# Patient Record
Sex: Female | Born: 1950
Health system: Southern US, Community
[De-identification: ages and names within clinical notes are randomized; demographics above are authoritative.]

## PROBLEM LIST (undated history)

## (undated) DIAGNOSIS — M8580 Other specified disorders of bone density and structure, unspecified site: Secondary | ICD-10-CM

## (undated) DIAGNOSIS — I493 Ventricular premature depolarization: Secondary | ICD-10-CM

## (undated) DIAGNOSIS — E785 Hyperlipidemia, unspecified: Secondary | ICD-10-CM

## (undated) DIAGNOSIS — F419 Anxiety disorder, unspecified: Secondary | ICD-10-CM

## (undated) DIAGNOSIS — F329 Major depressive disorder, single episode, unspecified: Secondary | ICD-10-CM

## (undated) DIAGNOSIS — I10 Essential (primary) hypertension: Secondary | ICD-10-CM

## (undated) DIAGNOSIS — E669 Obesity, unspecified: Secondary | ICD-10-CM

## (undated) DIAGNOSIS — F32A Depression, unspecified: Secondary | ICD-10-CM

## (undated) DIAGNOSIS — T7840XA Allergy, unspecified, initial encounter: Secondary | ICD-10-CM

## (undated) DIAGNOSIS — H269 Unspecified cataract: Secondary | ICD-10-CM

## (undated) HISTORY — DX: Hyperlipidemia, unspecified: E78.5

## (undated) HISTORY — PX: CATARACT EXTRACTION: SUR2

## (undated) HISTORY — DX: Essential (primary) hypertension: I10

## (undated) HISTORY — PX: WISDOM TOOTH EXTRACTION: SHX21

## (undated) HISTORY — PX: BROW LIFT: SHX178

## (undated) HISTORY — DX: Depression, unspecified: F32.A

## (undated) HISTORY — DX: Anxiety disorder, unspecified: F41.9

## (undated) HISTORY — DX: Obesity, unspecified: E66.9

## (undated) HISTORY — DX: Major depressive disorder, single episode, unspecified: F32.9

## (undated) HISTORY — PX: TONSILLECTOMY: SUR1361

## (undated) HISTORY — PX: TUBAL LIGATION: SHX77

## (undated) HISTORY — DX: Unspecified cataract: H26.9

## (undated) HISTORY — DX: Allergy, unspecified, initial encounter: T78.40XA

## (undated) HISTORY — PX: OTHER SURGICAL HISTORY: SHX169

## (undated) HISTORY — DX: Other specified disorders of bone density and structure, unspecified site: M85.80

## (undated) HISTORY — DX: Ventricular premature depolarization: I49.3

---

## 2003-07-11 HISTORY — PX: COLONOSCOPY: SHX174

## 2004-05-23 ENCOUNTER — Ambulatory Visit: Payer: Self-pay | Admitting: Gastroenterology

## 2004-06-06 ENCOUNTER — Ambulatory Visit: Payer: Self-pay | Admitting: Gastroenterology

## 2005-01-23 ENCOUNTER — Encounter: Admission: RE | Admit: 2005-01-23 | Discharge: 2005-01-23 | Payer: Self-pay | Admitting: Internal Medicine

## 2005-03-16 ENCOUNTER — Ambulatory Visit (HOSPITAL_BASED_OUTPATIENT_CLINIC_OR_DEPARTMENT_OTHER): Admission: RE | Admit: 2005-03-16 | Discharge: 2005-03-16 | Payer: Self-pay | Admitting: Orthopedic Surgery

## 2005-03-16 ENCOUNTER — Ambulatory Visit (HOSPITAL_COMMUNITY): Admission: RE | Admit: 2005-03-16 | Discharge: 2005-03-16 | Payer: Self-pay | Admitting: Orthopedic Surgery

## 2006-10-03 ENCOUNTER — Encounter: Admission: RE | Admit: 2006-10-03 | Discharge: 2006-10-03 | Payer: Self-pay | Admitting: Emergency Medicine

## 2006-10-04 ENCOUNTER — Ambulatory Visit: Payer: Self-pay

## 2006-10-29 ENCOUNTER — Ambulatory Visit: Payer: Self-pay | Admitting: Cardiology

## 2006-11-20 ENCOUNTER — Ambulatory Visit: Payer: Self-pay | Admitting: Cardiology

## 2010-11-22 NOTE — Assessment & Plan Note (Signed)
[facility]                            CARDIOLOGY OFFICE NOTE   NAME:Twichell, TASHIBA TIMONEY                         MRN:          [id number]  DATE:11/20/2006                            DOB:          16-Sep-1950    FOLLOWUP VISIT   PRIMARY CARE PHYSICIAN:  Dr. Elmore Guise.   REASON FOR VISIT:  Followup chest pain.   HISTORY OF PRESENT ILLNESS:  I saw Ms. Ciavarella back in late April.  Her  history and testing is detailed in my previous note.  Since that visit,  she was able to see Dr. Chilton Si back in the office.  She has been taken  off of Crestor and states that actually some of her symptoms of chest  aching have improved since then.  We spoke last time about possible  followup testing, including noninvasive and invasive techniques.  Her  symptoms were atypical and I did not feel strongly about a cardiac  catheterization, although we did discuss it as a possible option.  On  the one hand, she is reassured by the improvements in her symptoms off  Crestor.  In some respects she worries about a component of stress,  predominantly centering around care for her elderly ill mother.  On the  other hand, she remains concerned to some degree about coronary disease  given her family history.  She is not yet ready to consider a cardiac  catheterization, however.   ALLERGIES:  No known drug allergies.   PRESENT MEDICATIONS:  1. Wellbutrin XL 300 mg p.o. daily.  2. Aspirin 81 mg p.o. daily.  3. Multivitamin 1 p.o. daily.   REVIEW OF SYSTEMS:  As described in the history of present illness.   EXAMINATION:  Blood pressure is 132/84, heart rate is 89, weight 146  pounds.  The patient was not otherwise re-examined today.   IMPRESSION/RECOMMENDATIONS:  History of atypical chest pain, relatively  improved although not completely, having stopped Crestor.  She had an  overall reassuring exercise Myoview without electrocardiographic or  perfusion data evidence of ischemia.  She  did experience chest pain  during the study.  At this point, she generally remains most comfortable  with observation only, although does still have some concerns about  potential cardiovascular disease given her family history.  We had  discussed previously the possibility of a cardiac CT angiogram as a more  anatomic way of assessing her, still in a noninvasive mode.  I think  this would be a reasonable option, however would be most useful if it  were completely normal.  Otherwise, it may actually lead to additional  studies.  She will consider the matter further and  already has scheduled followup with Dr. Chilton Si at the end of the month.  We remain available to assist with additional testing if the situation  changes.     Jonelle Sidle, MD  Electronically Signed    SGM/MedQ  DD: 11/20/2006  DT: 11/20/2006  Job #: [id number]   cc:   Erskine Speed, M.D.

## 2010-11-25 NOTE — Assessment & Plan Note (Signed)
[facility]                            CARDIOLOGY OFFICE NOTE   NAME:[name], [name] REVERON                         MRN:          [id number]  DATE:10/29/2006                            DOB:          1951-01-30    REFERRING PHYSICIAN:  Stan Head. [name] [name], M.D.   PRIMARY CARE PHYSICIAN:  Dr. Elmore [name].   INDICATION:  Chest pain, followup recent stress test.   HISTORY OF PRESENT ILLNESS:  [name] [name] is a pleasant 60 year old woman  with a history of hyperlipidemia recently treated with Crestor,  palpitations with remotely documented premature ventricular complexes  (saw [name] [name] in the past), and family history of premature  cardiovascular disease including the patient's father and uncles  experiencing cardiovascular disease in their 95s to 32s.  She reports  being in her usual state of health until March.  She was driving and  began to develop an aching sensation in her left lower costal area.  This seemed to progress in intensity, but remained intermittent over the  next several days and ultimately by the next week she sought medical  attention when she began to experience this more into her chest area.  She noted that this discomfort was not exacerbated with exertion and  felt more like a soreness particularly around the left lower costal  margin.  She ultimately ended up seeing Dr. Cleta [name] who arranged a number  of followup studies including a chest x-ray demonstrating no active  disease, CT scan of the chest demonstrating no pulmonary embolus or  other acute finding, blood work indicating no leukocytosis and normal  renal function as well as liver function tests.  An exercise Myoview was  performed in our office on the 27th of March.  This particular study was  electrocardiographically negative for ischemia and showed no clear  evidence of scar or ischemia based on perfusion data with vigorous  ejection fraction of 76%.  The patient did experience chest pain  during  this study, and due to this is referred to discuss things further with  [place] today.   Electrocardiogram today demonstrates normal sinus rhythm at 75 beats per  minute.  [name] [name] states that she still intermittently feels discomfort  in her lower left costal area as discussed above, although it seems to  be less pronounced.  We discussed this today and the reassuring results  of her testing to this point.  Her Myoview is overall low risk, and I  discussed this with her today.  We talked about a number of options from  a cardiac perspective including either simple observation given her  reassuring Myoview findings versus the possibility of a cardiac CT  angiogram as an intermediate step versus continuing on to a diagnostic  cardiac catheterization.  [name] [name]'s symptoms are atypical from the  perspective of ischemia, and after our discussion we felt like  observation might be the best strategy from a cardiac perspective.  She  tells me that she is due for a followup visit with [name] [name] tomorrow  and plans to discuss the situation with him as  well.   ALLERGIES:  No known drug allergies.   PRESENT MEDICATIONS:  1. Wellbutrin XL 300 mg p.o. daily.  2. Crestor 10 mg p.o. daily.  3. Aspirin 81 mg p.o. daily.  4. Multivitamin one p.o. daily.  5. She takes Advair and Tylenol p.r.n.   PAST MEDICAL HISTORY:  As outlined above.  She reports a remote standard  treadmill test approximately 10 years ago and also an evaluation by Dr.  Graciela [name] due to premature ventricular complexes.  She states that these  have been very infrequent and not bothersome recently.  She has a  history of right shoulder surgery in 2006 and also a tubal ligation back  in the late 1980s.   FAMILY HISTORY:  As discussed above and includes premature  cardiovascular disease.   REVIEW OF SYSTEMS:  As per history of present illness.  She has had some  fatigue, anxiety, and depression symptoms.  No dizziness,  persistent  palpitations and syncope, orthopnea, PND, lower extremity edema or  claudication.   SOCIAL HISTORY:  The patient is married.  She has two children.  She  denies any active tobacco use.  Quit smoking in 1977.  She drinks one or  two alcoholic beverages daily and also 2-3 caffeinated beverages daily.  She has been trying to walk regularly sometimes up to 2-3 miles at a  time and notes that this does not worsen her chest discomfort.  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   PHYSICAL EXAMINATION:  VITAL SIGNS:  Blood pressure 136/84, heart rate  76, weight 153 pounds.  GENERAL:  The patient is comfortable and in no acute distress.  HEENT:  Conjunctivae is normal, oropharynx clear.  NECK:  Supple, no elevated jugular venous pressure without bruits.  No  thyromegaly is noted.  No thyroid tenderness noted.  LUNGS:  Clear without labored breathing.  Normal lung expansion  bilaterally.  CARDIAC:  Reveals a regular rate and rhythm.  No obvious pericardial  rub, no S3 gallop or loud murmur.  ABDOMEN:  Generally soft, nondistended.  Normoactive bowel sounds.  EXTREMITIES:  Exhibit no pitting edema.  SKIN:  Warm and dry.  MUSCULOSKELETAL:  No kyphosis is noted.  EXTREMITIES:  Distal pulses 2+.  NEUROPSYCHIATRIC:  Patient alert and oriented x3.  Affect is normal.   IMPRESSION/RECOMMENDATIONS:  1. History of intermittent atypical chest pain as discussed above in a      60 year old woman with history of hyperlipidemia recently placed on      statin therapy and family history of premature cardiovascular      disease.  Her electrocardiogram at baseline is normal, and she had      a recent exercise Myoview which showed no electrocardiographic or      perfusion evidence of ischemia.  She did experience some chest pain      during the study, but states that this has been intermittent since     March and typically is not worsened with exertion on  any regular      basis.  I discussed this with her today in detail and offered a      number of options as a next step as reviewed above.  We ultimately      elected to consider observation only at this point with planned      followup with [name] [name] tomorrow.  I think proceeding with      additional stress testing by another  modality would probably not      add any significant information to the present data.  A noninvasive      cardiac CT angiogram could be considered, although it would be only      useful if it were found to be completely normal.  One could      consider proceeding on to a diagnostic cardiac catheterization to      exclude the possibility of a false normal noninvasive test and      clearly assessed the coronary anatomy.  I do not feel strongly      about this, and [name] [name] indicated that she preferred observation.      Realizing that this would be an option if her symptoms persist or      worsen, she seemed most comfortable with this and I asked her to      discuss it further with  [name] [name].  I will, otherwise, plan to have her come back over the next  month to review her symptoms and to determine if we need to proceed  further with testing.  1. Further plans to follow.     Jonelle Sidle, MD  Electronically Signed    SGM/MedQ  DD: 10/29/2006  DT: 10/30/2006  Job #: [id number]   cc:   Brett Canales A. [name] [name], M.D.  Erskine Speed, M.D.

## 2010-11-25 NOTE — Op Note (Signed)
NAMELYNDZEE, KLIEBERT                  ACCOUNT NO.:  [id number]   MEDICAL RECORD NO.:  [id number]          PATIENT TYPE:  AMB   LOCATION:  DSC                          FACILITY:  [facility]   PHYSICIAN:  Katy Fitch. Sypher, M.D. DATE OF BIRTH:  01-May-1951   DATE OF PROCEDURE:  03/16/2005  DATE OF DISCHARGE:                                 OPERATIVE REPORT   PREOP DIAGNOSIS:  Painful adhesive capsulitis, right shoulder with evidence  of chronic subacromial impingement.   POSTOP DIAGNOSIS:  Painful adhesive capsulitis, right shoulder with evidence  of chronic subacromial impingement,with confirmation of extensive adhesive  capsulitis with rotator cuff tendinopathy without evidence of retracted  rotator cuff tear and AC degenerative change without prominent inferior  projecting osteophytes.   OPERATION:  1.  Examination of right shoulder under anesthesia with manipulation and      release of capsular adhesions.  2.  Arthroscopic evaluation right glenohumeral joint with extensive      debridement of adhesive capsulitis granulation tissue and capsulectomy      utilizing electrocautery and suction shaver.  3.  Is subacromial decompression with extensive bursectomy and tenolysis of      rotator cuff followed by coracoacromial ligament release and      acromioplasty.   OPERATING SURGEON:  Josephine Igo.   ASSISTANT:  Annye Rusk PA-C.   ANESTHESIA:  General by endotracheal technique supplemented by a right  interscalene block, supervising anesthesiologist Dr. Jacklynn Bue.   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 Nila Nephew for evaluation and  management of a painful right shoulder. She was seen in consultation February 08, 2005.   Dr. Chilton Si has identified a probable adhesive capsulitis of the shoulder and  had sent Mrs. Cumberledge for a MRI on January 23, 2005. This was accomplished at  [facility] and  interpreted by Dr. Holley Dexter to reveal  evidence of rotator cuff tendinopathy with a probable partial width tear at  the anterior supraspinatus without retraction or atrophy.  Mrs. Branson was  also noted to have a downsloping anatomy that created a mass effect on the  cuff and degenerative change at the [facility] joint. There was a chronic  subacromial and subdeltoid bursitis.   Clinical examination in the office on February 08, 2005 revealed significant  decrease in range of motion of the right shoulder with combined elevation 90  degrees limited by significant pain, internal rotation of the iliac crest  and external rotation of 60 degrees versus elevation 175 on the left,  external rotation 90 on the left and internal rotation to at least T12.   We advised a supervised course of therapy with appropriate analgesic  medication.   Mrs. Cid was seen in consultation by my physical therapist Berline Lopes,  who provided therapy services for 3 weeks.   Mrs. Reesman was unable to make any progress in therapy and had persistent  significant discomfort.   Due to failure to respond to nonoperative measures, we concluded that was  appropriate to proceed with  examination of the shoulder under anesthesia,  anticipating manipulation of the adhesive capsulitis, followed by  arthroscopic debridement and coagulation of granulation tissues.   Given the unfavorable acromial anatomy, a subacromial decompression was  anticipated with possible distal clavicle resection and possible rotator  cuff repair if direct observation confirmed the MRI findings.   After informed consent, Mrs. Snethen was brought to the operating room at this  time.   PROCEDURE:  Marae Cottrell is brought to the operating room and placed in supine  position on the table.   Following anesthesia consultation with Dr. Jacklynn Bue, a right interscalene  block was placed without complication.   Satisfactory anesthesia of the right forequarter was  achieved.   Ms. Pankonin was transferred to room one and placed in supine position on the  operating table. Under direct supervision of Dr. Jacklynn Bue, general  endotracheal anesthesia was induced followed by careful positioning in the  beach-chair position with the aid of a torso and head holder designed for  shoulder arthroscopy.   Examination of the right shoulder under anesthesia revealed combined  elevation of 80 degrees, external rotation of 60 degrees and internal  rotation of less than 10 degrees.   With gentle manipulation, lysis of the adhesions was audible and palpable  with recovery of elevation 175, external rotation of 90 and 90 degrees  abduction and internal rotation of approximately 80 degrees at 90 degrees  abduction. The glenohumeral joint remained stable to anterior posterior as  well as superior inferior translational stress.   The right arm and forequarter were then prepped with DuraPrep and draped  with impervious arthroscopy drapes.   The shoulder was distended with 20 mL of sterile saline brought in through a  spinal needle with an anterior approach. The scope was placed atraumatically  with a blunt trocar through a standard posterior viewing portal.   Diagnostic arthroscopy revealed a typical granulation tissue and clot  associated with adhesive capsulitis and manipulation.   The biceps origin and labrum was basically intact except for granulation  tissue anteriorly. The subscapularis tendon was invested with inflammatory  angiofibroblastic tissue leading to significant adhesions. The biceps tendon  was normal to the rotator interval. The subscapularis appeared to be intact.  The deep surface of the supraspinatus, infraspinatus and teres minor tendons  was easily visualized and found to be free of a retracted rotator cuff tear.  The inferior recess was inspected and found to have a moderate degree of granulation tissue with an intact labrum inferiorly and  posteriorly.   A 4.5-mm suction shaver was brought in anteriorly and used to perform  debridement of the granulations of the subscapularis and tenolysis of the  subscapularis. The rotator interval was released with suction shaver  debridement followed by use of bipolar cautery for hemostasis.   Photographs were obtained pre and post debridement to document the degree of  capsulotomy.   After release of the adhesions, external rotation of the arm at neutral of  80 degrees was achieved with excellent glide of the subscapularis.   After hemostasis was completed the scope was removed from glenohumeral joint  and placed in subacromial space.   An abundant bursitis was identified with significant inflammatory  granulations noted.   Photographic documentation of the bursal predicament was obtained followed  by use of the suction shaver to debride redundant bursa. The rotator cuff  tendons were tenolysed with the suction shaver followed by use of the  bipolar cautery device to obtain hemostasis and release  the coracoacromial  ligament. The acromion had a type 2 morphology. This was leveled with a  suction shaver to a type 1 morphology.   The [facility] joint was inspected and found to have some capsular inflammation  however, the distal clavicle was not prominent inferiorly and in my judgment  distal clavicle resection was not indicated at this time.   The rotator cuff tendons were cleaned of redundant bursa to a smooth  surface.   I could not identify a full-thickness rotator cuff tear but did identify and  area of significant bruising and hemorrhage laterally that probably  corresponded with the arthroscopic finding of severe tendinopathy.   There appeared to be no indications for rotator cuff repair. After final  hemostasis, the arthroscopic equipment was removed from the subacromial  space and the portals were repaired with mattress suture of 3-0 Prolene.   A compressive dressing applied  with Xeroflo, sterile gauze and Hypafix. Ms.  Eddleman was awakened from anesthesia and transferred to the recovery room with  stable vital signs.   PAS compression hose were used throughout the procedure as a deep vein  thrombosis prophylaxis.   We will encourage Ms. Henricksen to begin immediate ambulation and gentle  exercises for range of motion of the elbow and shoulder.   She will return to our office in 24 hours for inspection of her portals and  documentation x-ray followed by initiation of a gentle range of motion  exercise program.      Katy Fitch. Sypher, M.D.  Electronically Signed     RVS/MEDQ  D:  03/16/2005  T:  03/16/2005  Job:  [id number]   cc:   Erskine Speed, M.D.  [address]  [place]  [place] [address]  Fax: [phone]

## 2011-08-20 ENCOUNTER — Ambulatory Visit: Payer: BC Managed Care – PPO | Admitting: Physician Assistant

## 2011-08-20 VITALS — BP 149/87 | HR 101 | Temp 97.8°F | Resp 16 | Ht 64.0 in | Wt 167.2 lb

## 2011-08-20 DIAGNOSIS — J029 Acute pharyngitis, unspecified: Secondary | ICD-10-CM

## 2011-08-20 MED ORDER — IPRATROPIUM BROMIDE 0.03 % NA SOLN: 2.0000 | Freq: Two times a day (BID) | NASAL | Status: DC

## 2011-08-20 MED ORDER — GUAIFENESIN ER 1200 MG PO TB12: 1.0000 | ORAL_TABLET | Freq: Two times a day (BID) | ORAL | Status: DC | PRN

## 2011-08-20 NOTE — Progress Notes (Signed)
  Subjective:    Patient ID: [name] [name], female    DOB: [birth year], 61 y.o.   MRN: [id number]  HPI The patient presents with 2 weeks of sore throat. No fever or chills. No myalgias, arthralgias or rash. A little bit of nasal congestion and postnasal drainage. Also a little bit of ear fullness and popping. She has been sneezing intermittently. She doesn't feel bad but is getting ready to leave town and was concerned about the persistence of her symptoms.   Review of Systems As above. Otherwise negative.    Objective:   Physical Exam  Vitals reviewed. Constitutional: She is oriented to person, place, and time. She appears well-developed and well-nourished. No distress.  HENT:  Head: Normocephalic and atraumatic.  Right Ear: Hearing, tympanic membrane, external ear and ear canal normal.  Left Ear: Hearing, tympanic membrane, external ear and ear canal normal.  Nose: Mucosal edema and rhinorrhea present.  No foreign bodies. Right sinus exhibits no maxillary sinus tenderness and no frontal sinus tenderness. Left sinus exhibits no maxillary sinus tenderness and no frontal sinus tenderness.  Mouth/Throat: Uvula is midline, oropharynx is clear and moist and mucous membranes are normal. No uvula swelling. No oropharyngeal exudate.  Eyes: Conjunctivae and EOM are normal. Pupils are equal, round, and reactive to light. Right eye exhibits no discharge. Left eye exhibits no discharge. No scleral icterus.  Neck: Trachea normal, normal range of motion and full passive range of motion without pain. Neck supple. No mass and no thyromegaly present.  Cardiovascular: Normal rate, regular rhythm and normal heart sounds.   Pulmonary/Chest: Effort normal and breath sounds normal.  Lymphadenopathy:       Head (right side): No submandibular, no tonsillar, no preauricular, no posterior auricular and no occipital adenopathy present.       Head (left side): No submandibular, no tonsillar, no preauricular and no  occipital adenopathy present.    She has no cervical adenopathy.       Right: No supraclavicular adenopathy present.       Left: No supraclavicular adenopathy present.  Neurological: She is alert and oriented to person, place, and time. She has normal strength. No cranial nerve deficit or sensory deficit.  Skin: Skin is warm, dry and intact. No rash noted.  Psychiatric: She has a normal mood and affect. Her speech is normal and behavior is normal.      Results for orders placed in visit on 08/20/11  POCT RAPID STREP A (OFFICE)      Component Value Range   Rapid Strep A Screen Negative  Negative     Assessment & Plan:  Pharyngitis, mild viral URI.  Supportive care. OTC Mucinex maximum strength, Atrovent nasal spray.  Her blood pressure is elevated today. When she as well she will have it rechecked. If it remains elevated she will need medication.

## 2011-08-20 NOTE — Patient Instructions (Signed)
Rest, drink lots of liquids.  Get you blood pressure checked when you're well!

## 2011-08-29 ENCOUNTER — Telehealth: Payer: Self-pay

## 2011-08-29 NOTE — Telephone Encounter (Signed)
Pt left her insurance card here and would like it mailed to her  Address in epic is correct.

## 2011-09-12 ENCOUNTER — Telehealth: Payer: Self-pay

## 2012-06-13 NOTE — Telephone Encounter (Signed)
No message

## 2014-12-11 ENCOUNTER — Encounter: Payer: Self-pay | Admitting: Gastroenterology

## 2015-02-10 ENCOUNTER — Encounter: Payer: Self-pay | Admitting: Gastroenterology

## 2015-02-19 ENCOUNTER — Encounter: Payer: Self-pay | Admitting: Gastroenterology

## 2015-11-22 ENCOUNTER — Encounter: Payer: Self-pay | Admitting: Gastroenterology

## 2015-11-22 ENCOUNTER — Telehealth: Payer: Self-pay | Admitting: Gastroenterology

## 2015-11-22 NOTE — Telephone Encounter (Signed)
OK 

## 2015-11-22 NOTE — Telephone Encounter (Signed)
LM on voicemail to call back to schedule an appt

## 2016-01-10 ENCOUNTER — Ambulatory Visit (AMBULATORY_SURGERY_CENTER): Payer: Self-pay | Admitting: *Deleted

## 2016-01-10 VITALS — Ht 64.0 in | Wt 175.0 lb

## 2016-01-10 DIAGNOSIS — Z1211 Encounter for screening for malignant neoplasm of colon: Secondary | ICD-10-CM

## 2016-01-10 MED ORDER — NA SULFATE-K SULFATE-MG SULF 17.5-3.13-1.6 GM/177ML PO SOLN: 1.0000 | Freq: Once | ORAL | Status: DC

## 2016-01-10 NOTE — Progress Notes (Signed)
No egg or soy allergy known to patient  No issues with past sedation with any surgeries  or procedures, no intubation problems  No diet pills per patient No home 02 use per patient  No blood thinners per patient  Pt denies issues with constipation  emmi declined Pt had called and asked to change providers to stark, starl ok'd and appt was scheduled with nandigam. Cancelled nandigam and RS with pt to stark 8-1 at 330 pm.

## 2016-01-24 ENCOUNTER — Encounter: Payer: Self-pay | Admitting: Gastroenterology

## 2016-01-25 ENCOUNTER — Encounter: Payer: Self-pay | Admitting: Gastroenterology

## 2016-02-08 ENCOUNTER — Encounter: Payer: Self-pay | Admitting: Gastroenterology

## 2016-02-08 ENCOUNTER — Ambulatory Visit (AMBULATORY_SURGERY_CENTER): Payer: 59 | Admitting: Gastroenterology

## 2016-02-08 VITALS — BP 113/65 | HR 63 | Temp 97.7°F | Resp 13 | Ht 64.0 in | Wt 175.0 lb

## 2016-02-08 DIAGNOSIS — D124 Benign neoplasm of descending colon: Secondary | ICD-10-CM | POA: Diagnosis not present

## 2016-02-08 DIAGNOSIS — Z1211 Encounter for screening for malignant neoplasm of colon: Secondary | ICD-10-CM

## 2016-02-08 MED ORDER — SODIUM CHLORIDE 0.9 % IV SOLN: 500.0000 mL | INTRAVENOUS | Status: AC

## 2016-02-08 NOTE — Progress Notes (Signed)
Called to room to assist during endoscopic procedure.  Patient ID and intended procedure confirmed with present staff. Received instructions for my participation in the procedure from the performing physician.  

## 2016-02-08 NOTE — Patient Instructions (Signed)
YOU HAD AN ENDOSCOPIC PROCEDURE TODAY AT [facility]:   Refer to the procedure report that was given to you for any specific questions about what was found during the examination.  If the procedure report does not answer your questions, please call your gastroenterologist to clarify.  If you requested that your care partner not be given the details of your procedure findings, then the procedure report has been included in a sealed envelope for you to review at your convenience later.  YOU SHOULD EXPECT: Some feelings of bloating in the abdomen. Passage of more gas than usual.  Walking can help get rid of the air that was put into your GI tract during the procedure and reduce the bloating. If you had a lower endoscopy (such as a colonoscopy or flexible sigmoidoscopy) you may notice spotting of blood in your stool or on the toilet paper. If you underwent a bowel prep for your procedure, you may not have a normal bowel movement for a few days.  Please Note:  You might notice some irritation and congestion in your nose or some drainage.  This is from the oxygen used during your procedure.  There is no need for concern and it should clear up in a day or so.  SYMPTOMS TO REPORT IMMEDIATELY:   Following lower endoscopy (colonoscopy or flexible sigmoidoscopy):  Excessive amounts of blood in the stool  Significant tenderness or worsening of abdominal pains  Swelling of the abdomen that is new, acute  Fever of 100F or higher   For urgent or emergent issues, a gastroenterologist can be reached at any hour by calling [phone].   DIET: Your first meal following the procedure should be a small meal and then it is ok to progress to your normal diet. Heavy or fried foods are harder to digest and may make you feel nauseous or bloated.  Likewise, meals heavy in dairy and vegetables can increase bloating.  Drink plenty of fluids but you should avoid alcoholic beverages for 24  hours.  ACTIVITY:  You should plan to take it easy for the rest of today and you should NOT DRIVE or use heavy machinery until tomorrow (because of the sedation medicines used during the test).    FOLLOW UP: Our staff will call the number listed on your records the next business day following your procedure to check on you and address any questions or concerns that you may have regarding the information given to you following your procedure. If we do not reach you, we will leave a message.  However, if you are feeling well and you are not experiencing any problems, there is no need to return our call.  We will assume that you have returned to your regular daily activities without incident.  If any biopsies were taken you will be contacted by phone or by letter within the next 1-3 weeks.  Please call us at [phone] if you have not heard about the biopsies in 3 weeks.    SIGNATURES/CONFIDENTIALITY: You and/or your care partner have signed paperwork which will be entered into your electronic medical record.  These signatures attest to the fact that that the information above on your After Visit Summary has been reviewed and is understood.  Full responsibility of the confidentiality of this discharge information lies with you and/or your care-partner.  Polyp and diverticulosis handout provided. Resume current medications. High fiber diet handout provided.

## 2016-02-08 NOTE — Progress Notes (Signed)
A/ox3 pleased with MAC, report to /Burnette RN 

## 2016-02-08 NOTE — Op Note (Signed)
[facility] Patient Name: [name] [name] Procedure Date: 02/08/2016 2:54 PM MRN: [id number] Endoscopist: Ladene Artist , MD Age: 65 Referring MD:  Date of Birth: 25-Mar-1951 Gender: Female Account #: [id number] Procedure:                Colonoscopy Indications:              Screening for colorectal malignant neoplasm Medicines:                Monitored Anesthesia Care Procedure:                Pre-Anesthesia Assessment:                           - Prior to the procedure, a History and Physical                            was performed, and patient medications and                            allergies were reviewed. The patient's tolerance of                            previous anesthesia was also reviewed. The risks                            and benefits of the procedure and the sedation                            options and risks were discussed with the patient.                            All questions were answered, and informed consent                            was obtained. Prior Anticoagulants: The patient has                            taken no previous anticoagulant or antiplatelet                            agents. ASA Grade Assessment: II - A patient with                            mild systemic disease. After reviewing the risks                            and benefits, the patient was deemed in                            satisfactory condition to undergo the procedure.                           After obtaining informed consent, the colonoscope  was passed under direct vision. Throughout the                            procedure, the patient's blood pressure, pulse, and                            oxygen saturations were monitored continuously. The                            Model PCF-H190DL ([id number]) scope was introduced                            through the anus and advanced to the the cecum,                            identified by  appendiceal orifice and ileocecal                            valve. The ileocecal valve, appendiceal orifice,                            and rectum were photographed. The quality of the                            bowel preparation was excellent. The colonoscopy                            was performed without difficulty. The patient                            tolerated the procedure well. Scope In: 3:03:33 PM Scope Out: 3:19:30 PM Scope Withdrawal Time: 0 hours 14 minutes 2 seconds  Total Procedure Duration: 0 hours 15 minutes 57 seconds  Findings:                 A 6 mm polyp was found in the descending colon. The                            polyp was sessile. The polyp was removed with a                            cold snare. Resection and retrieval were complete.                           A few small-mouthed diverticula were found in the                            sigmoid colon. There was no evidence of                            diverticular bleeding.                           The exam was otherwise normal throughout the  examined colon.                           The retroflexed view of the distal rectum and anal                            verge was normal and showed no anal or rectal                            abnormalities. Complications:            No immediate complications. Estimated blood loss:                            None. Estimated Blood Loss:     Estimated blood loss: none. Impression:               - One 6 mm polyp in the descending colon, removed                            with a cold snare. Resected and retrieved.                           - Mild diverticulosis in the sigmoid colon. There                            was no evidence of diverticular bleeding.                           - The distal rectum and anal verge are normal on                            retroflexion view. The colon otherwise appeared                             normal. Recommendation:           - Repeat colonoscopy in 5 years for surveillance if                            polyp is precancerous, otherwise 10 years for                            screening.                           - Patient has a contact number available for                            emergencies. The signs and symptoms of potential                            delayed complications were discussed with the                            patient. Return to normal activities tomorrow.  Written discharge instructions were provided to the                            patient.                           - High fiber diet.                           - Resume current medications.                           - Await pathology results. Ladene Artist, MD 02/08/2016 3:24:22 PM This report has been signed electronically.

## 2016-02-09 ENCOUNTER — Telehealth: Payer: Self-pay

## 2016-02-09 NOTE — Telephone Encounter (Signed)
  Follow up Call-  Call back number 02/08/2016  Post procedure Call Back phone  # [phone]  Permission to leave phone message Yes  Some recent data might be hidden     Patient questions:  Do you have a fever, pain , or abdominal swelling? No. Pain Score  0 *  Have you tolerated food without any problems? Yes.    Have you been able to return to your normal activities? Yes.    Do you have any questions about your discharge instructions: Diet   No. Medications  No. Follow up visit  No.  Do you have questions or concerns about your Care? No.  Actions: * If pain score is 4 or above: No action needed, pain <4.

## 2016-02-11 ENCOUNTER — Encounter: Payer: Self-pay | Admitting: Gastroenterology

## 2019-08-08 ENCOUNTER — Ambulatory Visit: Payer: 59

## 2019-08-14 ENCOUNTER — Ambulatory Visit: Payer: Medicare Other

## 2019-08-21 ENCOUNTER — Ambulatory Visit: Payer: Medicare Other

## 2019-08-25 ENCOUNTER — Ambulatory Visit: Payer: 59

## 2019-09-11 DIAGNOSIS — E785 Hyperlipidemia, unspecified: Secondary | ICD-10-CM | POA: Insufficient documentation

## 2021-01-26 ENCOUNTER — Telehealth: Payer: Self-pay

## 2021-01-26 NOTE — Telephone Encounter (Signed)
Left message for [facility] to send over records for the patient at 3:51pm.   Received a call back at 4:40pm from the medical records dept stating they would try their best to send the records over today and if not it would be first thing in the morning.

## 2021-01-27 ENCOUNTER — Other Ambulatory Visit: Payer: Self-pay

## 2021-01-27 ENCOUNTER — Encounter: Payer: Self-pay | Admitting: Interventional Cardiology

## 2021-01-27 ENCOUNTER — Ambulatory Visit (INDEPENDENT_AMBULATORY_CARE_PROVIDER_SITE_OTHER): Payer: Medicare Other | Admitting: Interventional Cardiology

## 2021-01-27 VITALS — BP 138/90 | HR 65 | Ht 64.0 in | Wt 166.2 lb

## 2021-01-27 DIAGNOSIS — Z8249 Family history of ischemic heart disease and other diseases of the circulatory system: Secondary | ICD-10-CM

## 2021-01-27 DIAGNOSIS — E785 Hyperlipidemia, unspecified: Secondary | ICD-10-CM | POA: Diagnosis not present

## 2021-01-27 DIAGNOSIS — I493 Ventricular premature depolarization: Secondary | ICD-10-CM

## 2021-01-27 NOTE — Patient Instructions (Signed)
Medication Instructions:  Your physician recommends that you continue on your current medications as directed. Please refer to the Current Medication list given to you today.  *If you need a refill on your cardiac medications before your next appointment, please call your pharmacy*   Lab Work: none If you have labs (blood work) drawn today and your tests are completely normal, you will receive your results only by: [facility] (if you have MyChart) OR A paper copy in the mail If you have any lab test that is abnormal or we need to change your treatment, we will call you to review the results.   Testing/Procedures: Dr Irish Lack recommends you have a Calcium Score CT scan   Follow-Up: At [facility], you and your health needs are our priority.  As part of our continuing mission to provide you with exceptional heart care, we have created designated Provider Care Teams.  These Care Teams include your primary Cardiologist (physician) and Advanced Practice Providers (APPs -  Physician Assistants and Nurse Practitioners) who all work together to provide you with the care you need, when you need it.  We recommend signing up for the patient portal called "MyChart".  Sign up information is provided on this After Visit Summary.  MyChart is used to connect with patients for Virtual Visits (Telemedicine).  Patients are able to view lab/test results, encounter notes, upcoming appointments, etc.  Non-urgent messages can be sent to your provider as well.   To learn more about what you can do with MyChart, go to [email or web address].    Your next appointment:   12 month(s)  The format for your next appointment:   In Person  Provider:   You may see Larae Grooms, MD or one of the following Advanced Practice Providers on your designated Care Team:   Melina Copa, PA-C Ermalinda Barrios, PA-C   Other Instructions

## 2021-01-27 NOTE — Progress Notes (Signed)
Cardiology Office Note   Date:  01/27/2021   ID:  [name] [name], DOB 1950/09/17, MRN [id number]  PCP:  Levin Erp, MD    No chief complaint on file.  hyperlipidemia  Wt Readings from Last 3 Encounters:  01/27/21 166 lb 3.2 oz (75.4 kg)  02/08/16 175 lb (79.4 kg)  01/10/16 175 lb (79.4 kg)       History of Present Illness: [name] [name] is a 70 y.o. female who is being seen today for the evaluation of hyperlipidemia, family h/o CAD at the request of Wile, Jesse Sans, MD.   Father and paternal uncles with early CAD, CABG at 64 for father. Sister with DM.    About 20 years ago, she was diagnosed with PVCs. She did a stress test as well which was negative.  Echo was done and was fine.   BP meds were increased recently.   Works with a Clinical research associate.  She has lost some weight.  No cardiac sx.  LDL 118 in 07/2020 while on Crestor 40 daily.  Home BP readings are in the 465K systolic on  Medicine.   Denies : Chest pain. Dizziness. Leg edema. Nitroglycerin use. Orthopnea. Palpitations. Paroxysmal nocturnal dyspnea. Shortness of breath. Syncope.    Past Medical History:  Diagnosis Date   Allergy    Anxiety    Cataract    bilateral and removed    Depression    Depression    Hyperlipidemia    Hypertension    Obesity    Osteopenia    PVC's (premature ventricular contractions)    no issues with this    Past Surgical History:  Procedure Laterality Date   BROW LIFT     CATARACT EXTRACTION Bilateral    with lens implants   COLONOSCOPY  2005   frozen shoulder Right    TONSILLECTOMY     as child    TUBAL LIGATION     WISDOM TOOTH EXTRACTION       Current Outpatient Medications  Medication Sig Dispense Refill   aspirin 81 MG tablet Take 160 mg by mouth daily.     buPROPion (WELLBUTRIN XL) 300 MG 24 hr tablet Take 300 mg by mouth daily.     calcium carbonate (OS-CAL) 1250 (500 Ca) MG chewable tablet Chew 1 tablet by mouth daily.     calcium-vitamin D (OSCAL WITH D)  500-200 MG-UNIT tablet Take 1 tablet by mouth.     irbesartan (AVAPRO) 150 MG tablet Take 150 mg by mouth daily.     loratadine (CLARITIN) 10 MG tablet Take 10 mg by mouth daily.     metoprolol succinate (TOPROL-XL) 50 MG 24 hr tablet Take 50 mg by mouth daily. Take with or immediately following a meal.     Multiple Vitamins-Minerals (MULTIVITAMIN WITH MINERALS) tablet Take 1 tablet by mouth daily.     rosuvastatin (CRESTOR) 40 MG tablet Take 40 mg by mouth daily.     Current Facility-Administered Medications  Medication Dose Route Frequency Provider Last Rate Last Admin   0.9 %  sodium chloride infusion  500 mL Intravenous Continuous Ladene Artist, MD        Allergies:   Codeine    Social History:  The patient  reports that she has never smoked. She has never used smokeless tobacco. She reports that she does not drink alcohol and does not use drugs.   Family History:  The patient's family history includes Arthritis in her mother; Coronary artery disease  in her father; Dementia in her mother; Depression in her mother; Diabetes in her father; Glaucoma in her mother; Hyperlipidemia in her father; Hypertension in her father; Thyroid disease in her mother.    ROS:  Please see the history of present illness.   Otherwise, review of systems are positive for difficulty controlling cholesterol.   All other systems are reviewed and negative.    PHYSICAL EXAM: VS:  BP 138/90   Pulse 65   Ht 5\' 4"  (1.626 m)   Wt 166 lb 3.2 oz (75.4 kg)   SpO2 97%   BMI 28.53 kg/m  , BMI Body mass index is 28.53 kg/m. GEN: Well nourished, well developed, in no acute distress HEENT: normal Neck: no JVD, carotid bruits, or masses Cardiac: RRR; no murmurs, rubs, or gallops,no edema  Respiratory:  clear to auscultation bilaterally, normal work of breathing GI: soft, nontender, nondistended, + BS MS: no deformity or atrophy; 3+ PT pulses bilaterally Skin: warm and dry, no rash Neuro:  Strength and sensation  are intact Psych: euthymic mood, full affect   EKG:   The ekg ordered today demonstrates normal ECG   Recent Labs: No results found for requested labs within last 8760 hours.   Lipid Panel No results found for: CHOL, TRIG, HDL, CHOLHDL, VLDL, LDLCALC, LDLDIRECT   Other studies Reviewed: Additional studies/ records that were reviewed today with results demonstrating: No coronary calcium noted on 2008 chest CT.   ASSESSMENT AND PLAN:  Hyperlipidemia: LDL controlled, < 130 on Crestor.  If calcium score is high, would need to intensify lipid lowering therapy.  Family h/o CAD: PLan for cardiac scoring CT. COntinue healthy lifestyle with regular exercise and healthy eating. PVCs: asymptomatic.  Diagnosed many years ago.    Current medicines are reviewed at length with the patient today.  The patient concerns regarding her medicines were addressed.  The following changes have been made:  No change  Labs/ tests ordered today include:  No orders of the defined types were placed in this encounter.   Recommend 150 minutes/week of aerobic exercise Low fat, low carb, high fiber diet recommended  Disposition:   FU in 1 year or sooner if CT is abnormal   Signed, Larae Grooms, MD  01/27/2021 10:20 AM    [facility] [address], [place], [place]  [address] Phone: [phone]; Fax: [phone]

## 2021-02-16 ENCOUNTER — Other Ambulatory Visit: Payer: Self-pay

## 2021-02-16 ENCOUNTER — Ambulatory Visit (INDEPENDENT_AMBULATORY_CARE_PROVIDER_SITE_OTHER)
Admission: RE | Admit: 2021-02-16 | Discharge: 2021-02-16 | Disposition: A | Discharge status: Home / Self Care | Payer: Self-pay | Source: Ambulatory Visit | Attending: Interventional Cardiology | Admitting: Interventional Cardiology

## 2021-02-16 DIAGNOSIS — Z8249 Family history of ischemic heart disease and other diseases of the circulatory system: Secondary | ICD-10-CM

## 2021-02-17 ENCOUNTER — Telehealth: Payer: Self-pay | Admitting: *Deleted

## 2021-02-17 DIAGNOSIS — E785 Hyperlipidemia, unspecified: Secondary | ICD-10-CM

## 2021-02-17 NOTE — Telephone Encounter (Signed)
I spoke with patient and reviewed results with her.  Comments also sent through my chart.  Lipid clinic appointment scheduled for March 02, 2021 at 9:30

## 2021-02-17 NOTE — Telephone Encounter (Signed)
Pt is returning call to [facility].

## 2021-02-17 NOTE — Telephone Encounter (Signed)
Left message to call office

## 2021-02-17 NOTE — Telephone Encounter (Signed)
-----   Message from Jettie Booze, MD sent at 02/16/2021  7:05 PM EDT ----- Calcium score in the 72%ile. Continue aggressive risk factor modification, healthy diet, regular exercise. Would try to keep LDL 100; (was LDL 118 on max dose Crestor).  Will refer to lipid clinic to see if she would be a candidate for PCSK9 inhibitor

## 2021-03-02 ENCOUNTER — Ambulatory Visit (INDEPENDENT_AMBULATORY_CARE_PROVIDER_SITE_OTHER): Payer: Medicare Other | Admitting: Pharmacist

## 2021-03-02 ENCOUNTER — Other Ambulatory Visit: Payer: Self-pay

## 2021-03-02 DIAGNOSIS — E785 Hyperlipidemia, unspecified: Secondary | ICD-10-CM

## 2021-03-02 DIAGNOSIS — I493 Ventricular premature depolarization: Secondary | ICD-10-CM | POA: Insufficient documentation

## 2021-03-02 DIAGNOSIS — R931 Abnormal findings on diagnostic imaging of heart and coronary circulation: Secondary | ICD-10-CM | POA: Insufficient documentation

## 2021-03-02 DIAGNOSIS — I10 Essential (primary) hypertension: Secondary | ICD-10-CM | POA: Insufficient documentation

## 2021-03-02 DIAGNOSIS — Z8249 Family history of ischemic heart disease and other diseases of the circulatory system: Secondary | ICD-10-CM | POA: Insufficient documentation

## 2021-03-02 MED ORDER — REPATHA SURECLICK 140 MG/ML ~~LOC~~ SOAJ: 1.0000 "pen " | SUBCUTANEOUS | 11 refills | Status: DC

## 2021-03-02 NOTE — Patient Instructions (Addendum)
It was nice to meet you today  Your LDL goal is < 70  Start taking Repatha. This is a subcutaneous injection given once every 2 weeks in the fatty tissue of your stomach or upper outer thigh. Store the medication in the fridge until you're ready to give a dose. It will lower your LDL cholesterol by 60% and also helps to prevent heart attacks and strokes  Continue taking your rosuvastatin '40mg'$  daily  Recheck fasting cholesterol on Monday, November 7th any time after 7:30am

## 2021-03-02 NOTE — Progress Notes (Addendum)
Patient ID: DERICA HEYNEN                 DOB: 02-Oct-1950                    MRN: [id number]     HPI: [name] [name] is a 70 y.o. female patient referred to lipid clinic by Dr Irish Lack. She was referred to cardiology by PCP for evaluation of hyperlipidemia and family history of CAD. Also has hx of HTN, PVCs, anxiety, depression. She underwent calcium scoring on 02/16/21 which was elevated at 72.4 (72nd percentile for age and sex matched control).  Pt presents today in good spirits. She has taken rosuvastatin for many years and has tolerated therapy well. Stays active walking her dogs multiple times a day, also has a trainer twice a week. Gained a bit of weight during COVID but successfully lost it using Weight Watchers diet. Is still trying to work on weight loss, knows heart healthy diet to focus on.  Current Medications: rosuvastatin '40mg'$  daily Risk Factors: elevated calcium score, FHx of premature CAD LDL goal: '70mg'$ /dL  Diet: Heart-healthy diet  Exercise: Works with a Clinical research associate twice a week, walks her 2 dogs multiple times per day  Family History: Father and paternal uncles with early CAD, father had CABG at 14 as well as DM, HLD, and HTN.  Social History: The patient  reports that she has never smoked. She has never used smokeless tobacco. She reports that she does not drink alcohol and does not use drugs.   Labs: 12/28/20: TC 231, TG 163, HDL 68, LDL 130, ApoB 133, ALT 56, AST 35 (rosuvastatin '40mg'$  daily)  Past Medical History:  Diagnosis Date   Allergy    Anxiety    Cataract    bilateral and removed    Depression    Depression    Hyperlipidemia    Hypertension    Obesity    Osteopenia    PVC's (premature ventricular contractions)    no issues with this    Current Outpatient Medications on File Prior to Visit  Medication Sig Dispense Refill   aspirin 81 MG tablet Take 160 mg by mouth daily.     buPROPion (WELLBUTRIN XL) 300 MG 24 hr tablet Take 300 mg by mouth daily.      calcium carbonate (OS-CAL) 1250 (500 Ca) MG chewable tablet Chew 1 tablet by mouth daily.     calcium-vitamin D (OSCAL WITH D) 500-200 MG-UNIT tablet Take 1 tablet by mouth.     irbesartan (AVAPRO) 150 MG tablet Take 150 mg by mouth daily.     loratadine (CLARITIN) 10 MG tablet Take 10 mg by mouth daily.     metoprolol succinate (TOPROL-XL) 50 MG 24 hr tablet Take 50 mg by mouth daily. Take with or immediately following a meal.     Multiple Vitamins-Minerals (MULTIVITAMIN WITH MINERALS) tablet Take 1 tablet by mouth daily.     rosuvastatin (CRESTOR) 40 MG tablet Take 40 mg by mouth daily.     Current Facility-Administered Medications on File Prior to Visit  Medication Dose Route Frequency Provider Last Rate Last Admin   0.9 %  sodium chloride infusion  500 mL Intravenous Continuous Ladene Artist, MD        Allergies  Allergen Reactions   Codeine Nausea Only    Assessment/Plan:  1. Hyperlipidemia - LDL 130 on rosuvastatin '40mg'$  daily, above goal < '70mg'$ /dL given elevated calcium score and family history of premature CAD.  Discussed addition of Nexlizet or PCSK9i therapy. Pt had preference for pill, although upon investigation her insurance does not cover Nexlizet. It does cover both PCSK9i as Tier 3 meds - $40/month copay which pt is agreeable to. Reviewed expected benefits and injection technique with pt today. Will continue rosuvastatin '40mg'$  daily and add Repatha '140mg'$  SQ Q2W, prior authorization approved through 09/02/21. Will recheck advanced lipid panel since ApoB was elevated at PCP a few months ago, scheduled in November.  Izaias Krupka E. Kenza Munar, PharmD, BCACP, [place] [id number] [address] [place], [place] [address] Phone: [phone]; Fax: [phone] 03/02/2021 10:04 AM

## 2021-05-16 ENCOUNTER — Other Ambulatory Visit: Payer: Self-pay

## 2021-05-16 ENCOUNTER — Other Ambulatory Visit: Payer: Medicare Other | Admitting: *Deleted

## 2021-05-16 DIAGNOSIS — E785 Hyperlipidemia, unspecified: Secondary | ICD-10-CM

## 2021-05-18 LAB — NMR, LIPOPROFILE
Cholesterol, Total: 123 mg/dL (ref 100–199)
HDL Particle Number: 44.8 umol/L (ref >=30.5)
HDL-C: 60 mg/dL (ref >39)
LDL Particle Number: 565 nmol/L (ref <1000)
LDL Size: 20.3 nm — ABNORMAL LOW (ref >20.5)
LDL-C (NIH Calc): 40 mg/dL (ref 0–99)
LP-IR Score: 41 (ref <=45)
Small LDL Particle Number: 347 nmol/L (ref <=527)
Triglycerides: 133 mg/dL (ref 0–149)

## 2021-05-18 LAB — HEPATIC FUNCTION PANEL
ALT: 65 IU/L — ABNORMAL HIGH (ref 0–32)
AST: 52 IU/L — ABNORMAL HIGH (ref 0–40)
Albumin: 4.5 g/dL (ref 3.8–4.8)
Alkaline Phosphatase: 74 IU/L (ref 44–121)
Bilirubin Total: 0.5 mg/dL (ref 0.0–1.2)
Bilirubin, Direct: 0.15 mg/dL (ref 0.00–0.40)
Total Protein: 6.3 g/dL (ref 6.0–8.5)

## 2021-05-18 LAB — APOLIPOPROTEIN B: Apolipoprotein B: 51 mg/dL (ref <90)

## 2021-05-18 LAB — LIPOPROTEIN A (LPA): Lipoprotein (a): <8.4 nmol/L (ref <75.0)

## 2021-06-15 ENCOUNTER — Other Ambulatory Visit: Payer: Self-pay | Admitting: Internal Medicine

## 2021-06-15 DIAGNOSIS — R748 Abnormal levels of other serum enzymes: Secondary | ICD-10-CM

## 2021-06-29 ENCOUNTER — Ambulatory Visit
Admission: RE | Admit: 2021-06-29 | Discharge: 2021-06-29 | Disposition: A | Discharge status: Home / Self Care | Payer: Medicare Other | Source: Ambulatory Visit | Attending: Internal Medicine | Admitting: Internal Medicine

## 2021-06-29 DIAGNOSIS — R748 Abnormal levels of other serum enzymes: Secondary | ICD-10-CM

## 2022-02-01 ENCOUNTER — Other Ambulatory Visit: Payer: Self-pay | Admitting: Interventional Cardiology

## 2022-05-01 NOTE — Progress Notes (Unsigned)
Cardiology Office Note   Date:  05/03/2022   ID:  [name] [name], DOB Nov 17, 1950, MRN [id number]  PCP:  Michael Boston, MD    No chief complaint on file.  Hyperlipidemia  Wt Readings from Last 3 Encounters:  05/03/22 161 lb (73 kg)  01/27/21 166 lb 3.2 oz (75.4 kg)  02/08/16 175 lb (79.4 kg)       History of Present Illness: [name] [name] is a 71 y.o. female with hyperlipidemia and family history of coronary artery disease.  Father and paternal uncles with early CAD, CABG at 46 for father. Sister with DM.  Diabetic sister had CABG x 4 in 2/23 at age 64. Younger brother with high cholesterol.     About 20 years ago, she was diagnosed with PVCs. She did a stress test as well which was negative.  Echo was done and was fine.   LDL 118 in 07/2020 while on Crestor 40 daily.  Calcium CT score: "Ascending aorta: Dilated at 3.9 cm   Pericardium: Normal   Coronary arteries:   LM: 0   LAD 62.6   RCA 9.78   Circumflex 0   Total: 72.4   IMPRESSION: Coronary calcium score of 72.4. 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  In 2023, LDL in June 2023 was down to 28 while taking Repatha and rosuvastatin 40 mg daily.  Dr. Jacalyn Lefevre decreased rosuvastatin to 10 mg at that time.  No side effects at the time with the higher dose of rosuvastatin.   Denies : Chest pain. Dizziness. Leg edema. Nitroglycerin use. Orthopnea. Palpitations. Paroxysmal nocturnal dyspnea. Shortness of breath. Syncope.    Walking for exercise.  Still works with a Clinical research associate 2x/week.  Past Medical History:  Diagnosis Date   Allergy    Anxiety    Cataract    bilateral and removed    Depression    Depression    Hyperlipidemia    Hypertension    Obesity    Osteopenia    PVC's (premature ventricular contractions)    no issues with this    Past Surgical History:  Procedure Laterality Date   BROW LIFT      CATARACT EXTRACTION Bilateral    with lens implants   COLONOSCOPY  2005   frozen shoulder Right    TONSILLECTOMY     as child    TUBAL LIGATION     WISDOM TOOTH EXTRACTION       Current Outpatient Medications  Medication Sig Dispense Refill   aspirin 81 MG tablet Take 160 mg by mouth daily.     buPROPion (WELLBUTRIN XL) 300 MG 24 hr tablet Take 300 mg by mouth daily.     irbesartan (AVAPRO) 150 MG tablet Take 150 mg by mouth daily.     loratadine (CLARITIN) 10 MG tablet Take 10 mg by mouth daily.     metoprolol succinate (TOPROL-XL) 50 MG 24 hr tablet Take 50 mg by mouth daily. Take with or immediately following a meal.     Multiple Vitamins-Minerals (MULTIVITAMIN WITH MINERALS) tablet Take 1 tablet by mouth daily.     REPATHA SURECLICK 326 MG/ML SOAJ INJECT 1 PEN INTO THE SKIN EVERY 14 DAYS 2 mL 11   rosuvastatin (CRESTOR) 10 MG tablet Take 10 mg by mouth daily.     Current Facility-Administered Medications  Medication Dose Route Frequency  Provider Last Rate Last Admin   0.9 %  sodium chloride infusion  500 mL Intravenous Continuous Ladene Artist, MD        Allergies:   Codeine    Social History:  The patient  reports that she has never smoked. She has never used smokeless tobacco. She reports that she does not drink alcohol and does not use drugs.   Family History:  The patient's family history includes Arthritis in her mother; Coronary artery disease in her father; Dementia in her mother; Depression in her mother; Diabetes in her father; Glaucoma in her mother; Hyperlipidemia in her father; Hypertension in her father; Thyroid disease in her mother.    ROS:  Please see the history of present illness.   Otherwise, review of systems are positive for .   All other systems are reviewed and negative.    PHYSICAL EXAM: VS:  BP 124/76   Pulse 61   Ht '5\' 4"'$  (1.626 m)   Wt 161 lb (73 kg)   SpO2 98%   BMI 27.64 kg/m  , BMI Body mass index is 27.64 kg/m. GEN: Well  nourished, well developed, in no acute distress HEENT: normal Neck: no JVD, carotid bruits, or masses Cardiac: RRR; no murmurs, rubs, or gallops,no edema  Respiratory:  clear to auscultation bilaterally, normal work of breathing GI: soft, nontender, nondistended, + BS MS: no deformity or atrophy Skin: warm and dry, no rash Neuro:  Strength and sensation are intact Psych: euthymic mood, full affect   EKG:   The ekg ordered today demonstrates NSR, no ST changes   Recent Labs: 05/16/2021: ALT 65   Lipid Panel No results found for: "CHOL", "TRIG", "HDL", "CHOLHDL", "VLDL", "LDLCALC", "LDLDIRECT"   Other studies Reviewed: Additional studies/ records that were reviewed today with results demonstrating: labs reviewed .   ASSESSMENT AND PLAN:  Hyperlipidemia: Will recheck lipids now that she has been on the lower dose rosuvastatin along with the [place].  There is a synergistic effect with statin and PCSK9 inhibitor.  She would benefit from staying on at least some dose of statin.  Given that her ALT was mildly elevated, may not need to 40 mg. Family history of coronary artery disease: Diabetic sister had CABG x 4 in 2/23 at age 79. She will need CEA as well. Whole food, plant based diet.  High-fiber diet.  Avoid processed foods. Has had PreDM in the past but it resolved with diet changes. Elevated LFTs:  ALT 50 on Crestor 40 mg dose, we will recheck the LFTs to see if they have improved on the 10 mg dose.  Current medicines are reviewed at length with the patient today.  The patient concerns regarding her medicines were addressed.  The following changes have been made:  No change  Labs/ tests ordered today include:  No orders of the defined types were placed in this encounter.   Recommend 150 minutes/week of aerobic exercise Low fat, low carb, high fiber diet recommended  Disposition:   FU in 1 year   Signed, Larae Grooms, MD  05/03/2022 9:23 AM    [facility] [place], [place], [place]  [address] Phone: [phone]; Fax: [phone]

## 2022-05-03 ENCOUNTER — Ambulatory Visit: Payer: Medicare Other | Attending: Interventional Cardiology | Admitting: Interventional Cardiology

## 2022-05-03 ENCOUNTER — Encounter: Payer: Self-pay | Admitting: Interventional Cardiology

## 2022-05-03 VITALS — BP 124/76 | HR 61 | Ht 64.0 in | Wt 161.0 lb

## 2022-05-03 DIAGNOSIS — E785 Hyperlipidemia, unspecified: Secondary | ICD-10-CM | POA: Diagnosis present

## 2022-05-03 DIAGNOSIS — R931 Abnormal findings on diagnostic imaging of heart and coronary circulation: Secondary | ICD-10-CM | POA: Insufficient documentation

## 2022-05-03 DIAGNOSIS — I493 Ventricular premature depolarization: Secondary | ICD-10-CM | POA: Insufficient documentation

## 2022-05-03 DIAGNOSIS — Z8249 Family history of ischemic heart disease and other diseases of the circulatory system: Secondary | ICD-10-CM | POA: Insufficient documentation

## 2022-05-03 MED ORDER — REPATHA SURECLICK 140 MG/ML ~~LOC~~ SOAJ: SUBCUTANEOUS | 11 refills | Status: DC

## 2022-05-03 NOTE — Patient Instructions (Signed)
Medication Instructions:  Your physician recommends that you continue on your current medications as directed. Please refer to the Current Medication list given to you today.   *If you need a refill on your cardiac medications before your next appointment, please call your pharmacy*   Lab Work: Lab work to be done today--Lipid and liver profiles If you have labs (blood work) drawn today and your tests are completely normal, you will receive your results only by: [place] (if you have MyChart) OR A paper copy in the mail If you have any lab test that is abnormal or we need to change your treatment, we will call you to review the results.   Testing/Procedures: none   Follow-Up: At [facility], you and your health needs are our priority.  As part of our continuing mission to provide you with exceptional heart care, we have created designated Provider Care Teams.  These Care Teams include your primary Cardiologist (physician) and Advanced Practice Providers (APPs -  Physician Assistants and Nurse Practitioners) who all work together to provide you with the care you need, when you need it.  We recommend signing up for the patient portal called "MyChart".  Sign up information is provided on this After Visit Summary.  MyChart is used to connect with patients for Virtual Visits (Telemedicine).  Patients are able to view lab/test results, encounter notes, upcoming appointments, etc.  Non-urgent messages can be sent to your provider as well.   To learn more about what you can do with MyChart, go to [email or web address].    Your next appointment:   12 month(s)  The format for your next appointment:   In Person  Provider:   Larae Grooms, MD     Other Instructions    Important Information About Sugar

## 2022-05-04 LAB — HEPATIC FUNCTION PANEL
ALT: 45 IU/L — ABNORMAL HIGH (ref 0–32)
AST: 44 IU/L — ABNORMAL HIGH (ref 0–40)
Albumin: 4.5 g/dL (ref 3.9–4.9)
Alkaline Phosphatase: 74 IU/L (ref 44–121)
Bilirubin Total: 0.4 mg/dL (ref 0.0–1.2)
Bilirubin, Direct: 0.21 mg/dL (ref 0.00–0.40)
Total Protein: 6.3 g/dL (ref 6.0–8.5)

## 2022-05-04 LAB — LIPID PANEL
Chol/HDL Ratio: 1.6 ratio (ref 0.0–4.4)
Cholesterol, Total: 94 mg/dL — ABNORMAL LOW (ref 100–199)
HDL: 59 mg/dL (ref >39)
LDL Chol Calc (NIH): 16 mg/dL (ref 0–99)
Triglycerides: 100 mg/dL (ref 0–149)
VLDL Cholesterol Cal: 19 mg/dL (ref 5–40)

## 2022-05-11 ENCOUNTER — Telehealth: Payer: Self-pay | Admitting: *Deleted

## 2022-05-11 DIAGNOSIS — E785 Hyperlipidemia, unspecified: Secondary | ICD-10-CM

## 2022-05-11 NOTE — Telephone Encounter (Signed)
-----   Message from Jettie Booze, MD sent at 05/05/2022 10:19 AM EDT ----- LDL still quite low on the lower dose of Crestor along with repatha.  WIll defer to lipid clinic about whether adjustments should be made.  Patient was interested in stopping Crestor completely.

## 2022-05-11 NOTE — Telephone Encounter (Signed)
Jettie Booze, MD 05/10/2022 10:37 AM EDT Back to Top    OK to stop Crestor.  Check liver and lipids in 3 months.    Left message for patient to call office

## 2022-05-12 NOTE — Telephone Encounter (Signed)
I spoke with patient and reviewed lab results/recommendations with her.  She will come in for fasting lab work on 08/10/22

## 2022-05-12 NOTE — Telephone Encounter (Signed)
Pt is returning call. Requesting returning call.

## 2022-07-18 ENCOUNTER — Other Ambulatory Visit (HOSPITAL_COMMUNITY): Payer: Self-pay

## 2022-08-10 ENCOUNTER — Other Ambulatory Visit: Payer: Medicare Other

## 2022-10-01 IMAGING — CT CT CARDIAC CORONARY ARTERY CALCIUM SCORE
2 series · 15 of 20 positions shown, 18 images · non-contrast
Comparison: 10/03/2006 chest CTA.
COMPARISON: 10/03/2006 chest CTA.

Addendum:
EXAM:
OVER-READ INTERPRETATION  CT CHEST

The following report is an over-read performed by radiologist Dr.
Wanagas Dobilas [REDACTED] on 02/16/2021. This over-read
does not include interpretation of cardiac or coronary anatomy or
pathology. The calcium score interpretation by the cardiologist is
attached.
CLINICAL DATA: Risk stratification
Coronary Calcium Score
TECHNIQUE: The patient was scanned on a Siemens Somatom 64 slice scanner. Axial
non-contrast 3 mm slices were carried out through the heart. The
data set was analyzed on a dedicated work station and scored using
the Agatson method.

[Series 2: casc 2.0 sa36 2 (id) (id) · axial · 0.43mm/px · z∈[-226,-124]mm · 10 of 63 slices shown, 13 images]
[im 6/63  vessel]
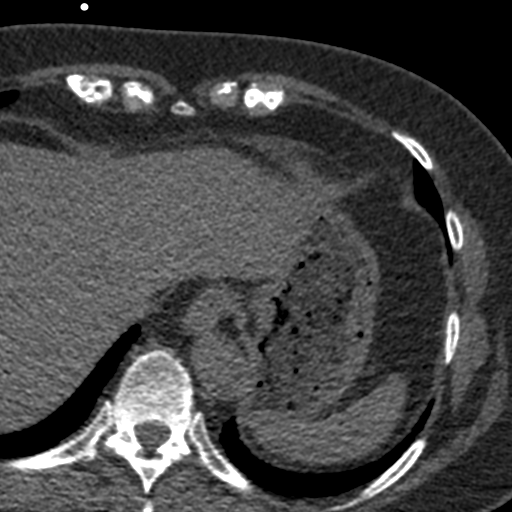
[im 6/63  lung]
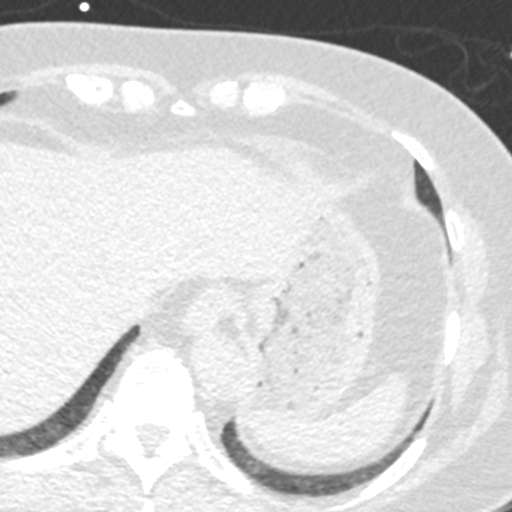
[im 12/63  vessel]
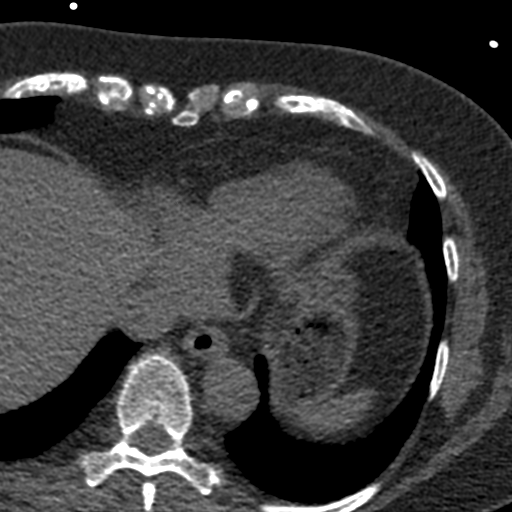
[im 17/63  vessel]
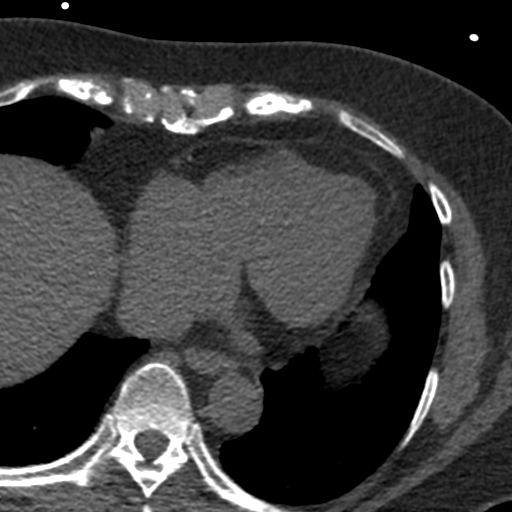
[im 23/63  vessel]
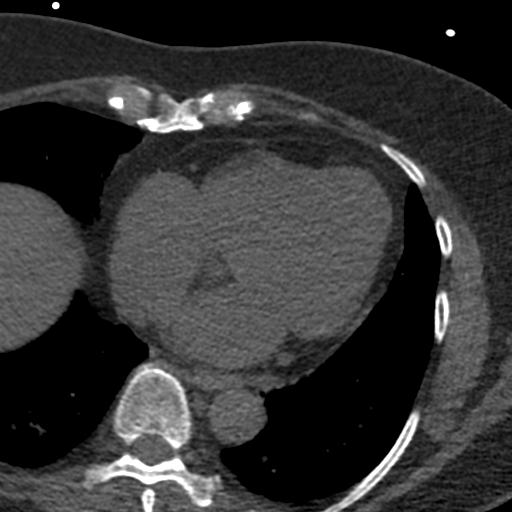
[im 29/63  vessel]
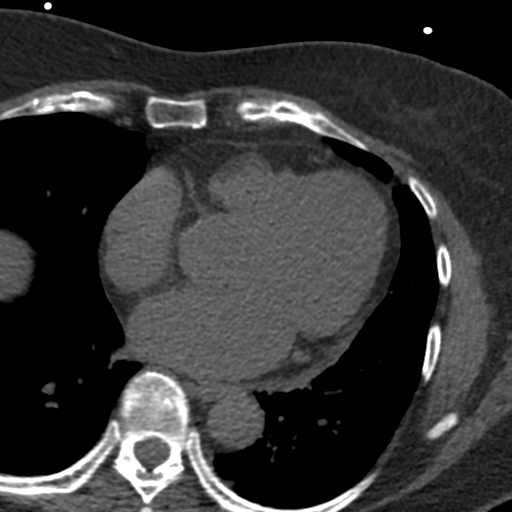
[im 29/63  lung]
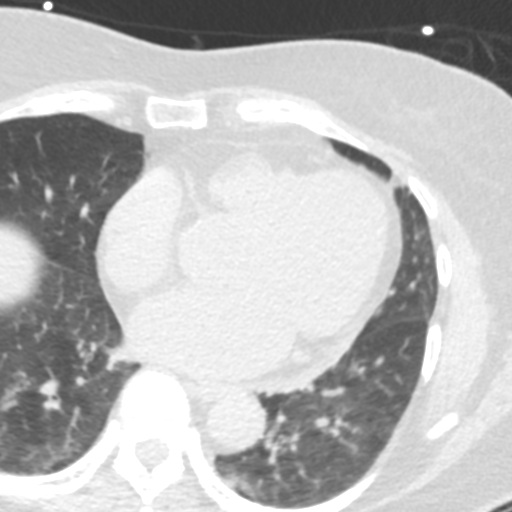
[im 34/63  vessel]
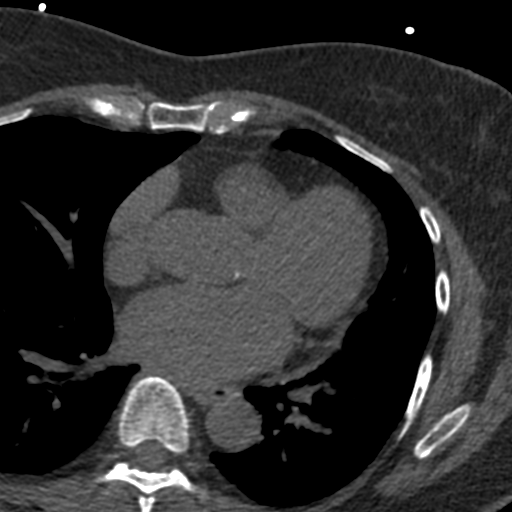
[im 40/63  vessel]
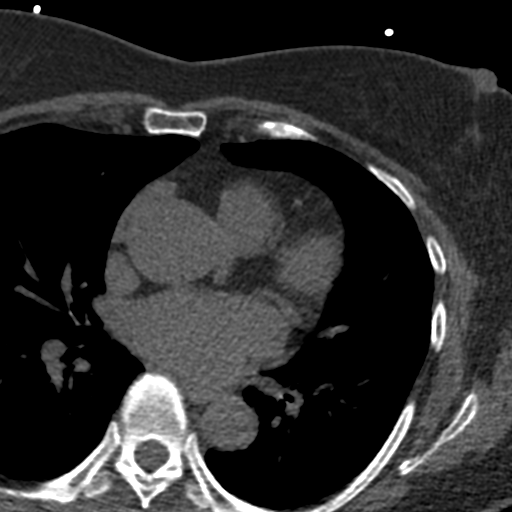
[im 46/63  vessel]
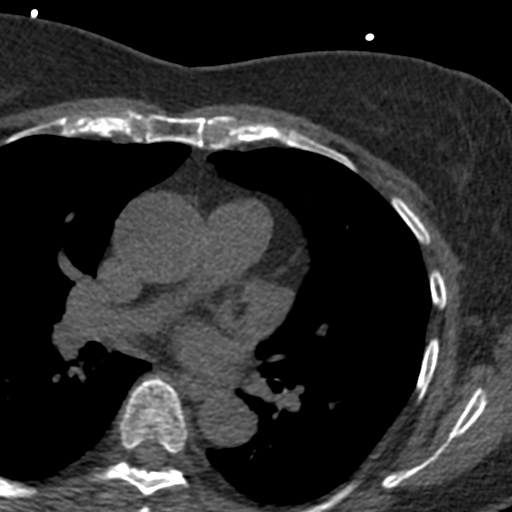
[im 51/63  vessel]
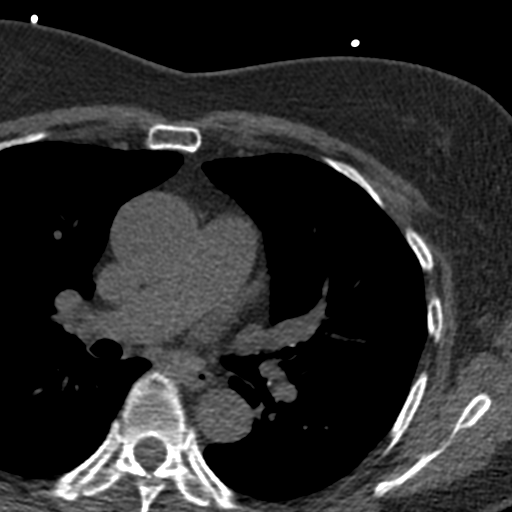
[im 51/63  lung]
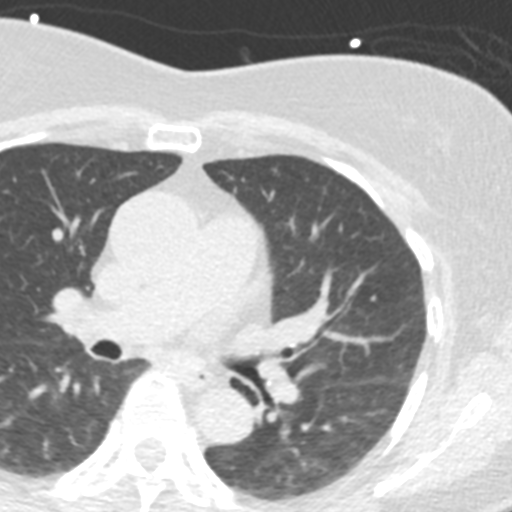
[im 57/63  vessel]
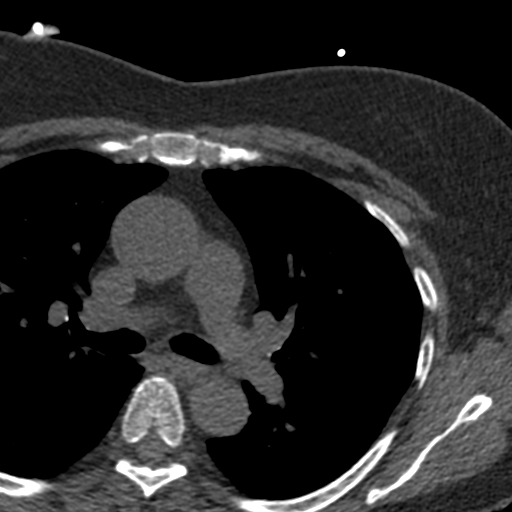

[Series 3: lung 65 % · axial · 0.66mm/px · z∈[-202,-130]mm · 5 of 42 slices shown]
[im 12/42  lung]
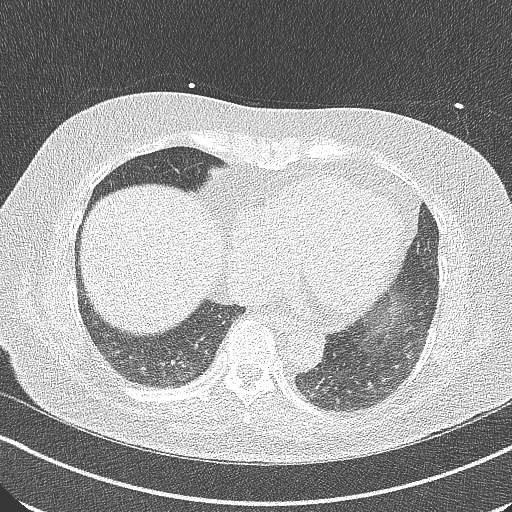
[im 18/42  lung]
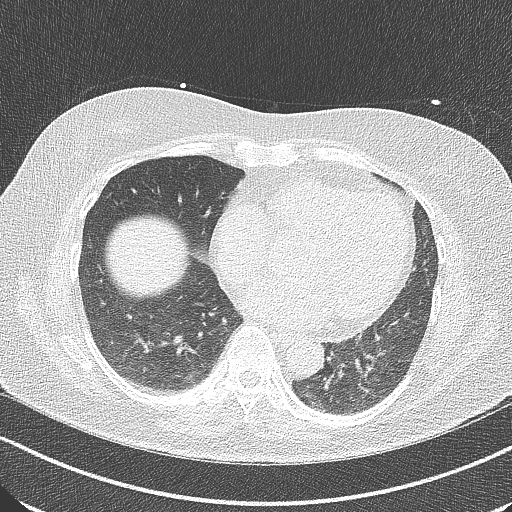
[im 24/42  lung]
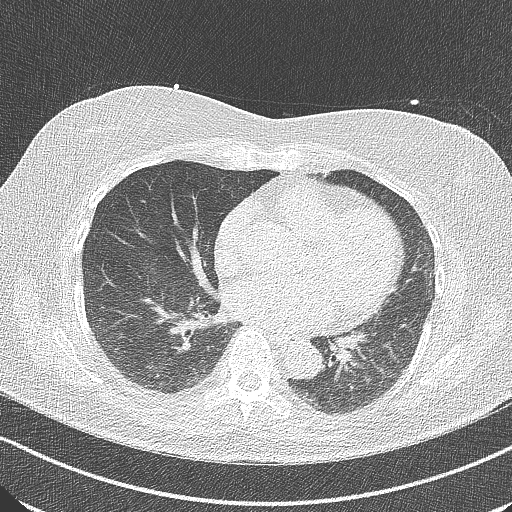
[im 30/42  lung]
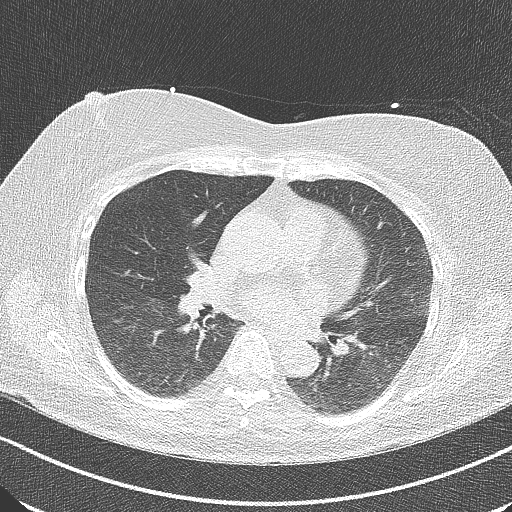
[im 36/42  lung]
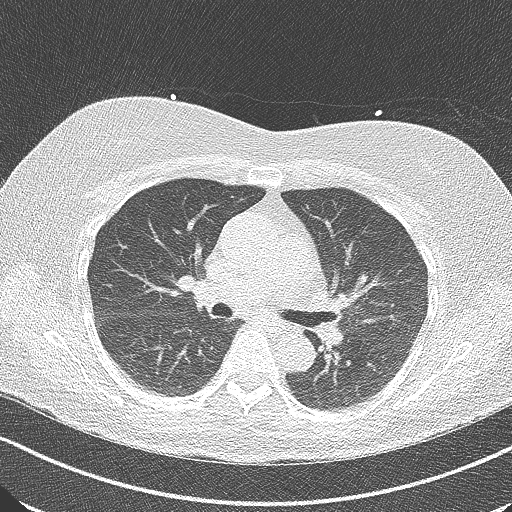

[15 of 20 positions shown; findings below may reference images not displayed]

FINDINGS: Vascular: Normal aortic caliber.

Mediastinum/Nodes: No imaged thoracic adenopathy.

Lungs/Pleura: No pleural fluid.  Clear imaged lungs.

Upper Abdomen: Normal imaged portions of the liver, spleen, stomach.

Musculoskeletal: No acute osseous abnormality.
IMPRESSION: No acute findings in the imaged extracardiac chest.
FINDINGS: Non-cardiac: See separate report from [REDACTED].

Ascending aorta: Dilated at 3.9 cm

Pericardium: Normal

Coronary arteries:

LM: 0

LAD

RCA

Circumflex 0

Total:
IMPRESSION: Coronary calcium score of 72.4. This was 72 nd percentile for age
and sex matched control.

Shahmar Ballayeva

*** End of Addendum ***
EXAM:
OVER-READ INTERPRETATION  CT CHEST

The following report is an over-read performed by radiologist Dr.
Wanagas Dobilas [REDACTED] on 02/16/2021. This over-read
does not include interpretation of cardiac or coronary anatomy or
pathology. The calcium score interpretation by the cardiologist is
attached.
FINDINGS: Vascular: Normal aortic caliber.

Mediastinum/Nodes: No imaged thoracic adenopathy.

Lungs/Pleura: No pleural fluid.  Clear imaged lungs.

Upper Abdomen: Normal imaged portions of the liver, spleen, stomach.

Musculoskeletal: No acute osseous abnormality.
IMPRESSION: No acute findings in the imaged extracardiac chest.

## 2023-01-05 LAB — LAB REPORT - SCANNED
A1c: 5
EGFR (Non-African Amer.): 61.9

## 2023-01-17 ENCOUNTER — Other Ambulatory Visit: Payer: Self-pay | Admitting: Internal Medicine

## 2023-01-17 DIAGNOSIS — M85859 Other specified disorders of bone density and structure, unspecified thigh: Secondary | ICD-10-CM

## 2023-02-11 IMAGING — US US ABDOMEN LIMITED
1 series · 14 of 25 positions shown · non-contrast
Comparison: None.

CLINICAL DATA: Elevated liver enzymes

EXAM:
ULTRASOUND ABDOMEN LIMITED RIGHT UPPER QUADRANT

[Series 1: us abdomen limited · 0.23mm/px · 14 of 37 slices shown]
[im 1/37]
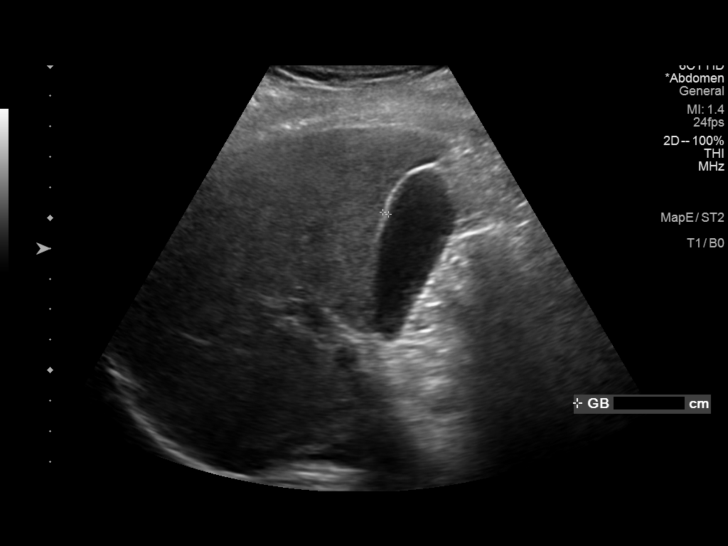
[im 4/37]
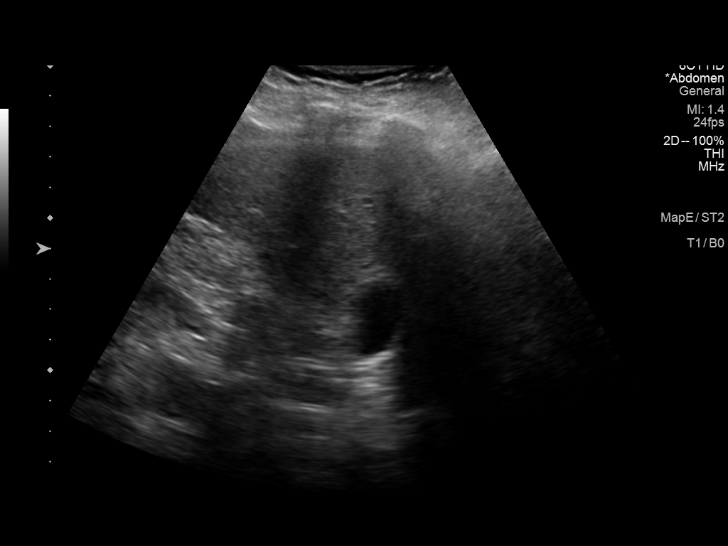
[im 7/37]
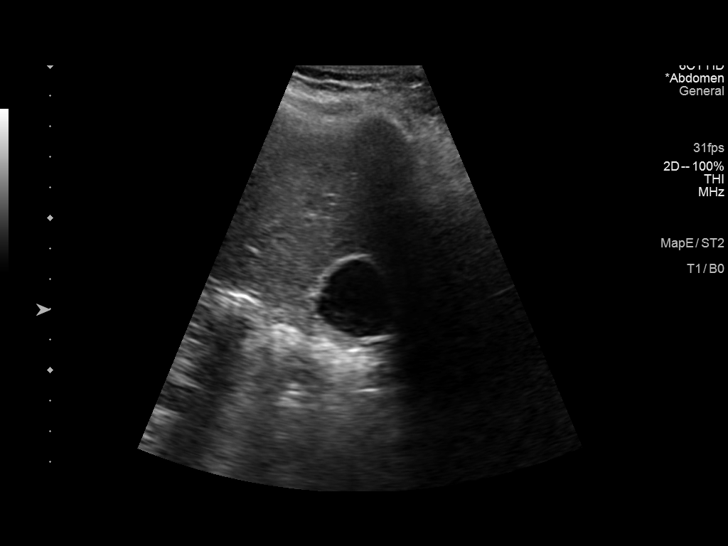
[im 10/37]
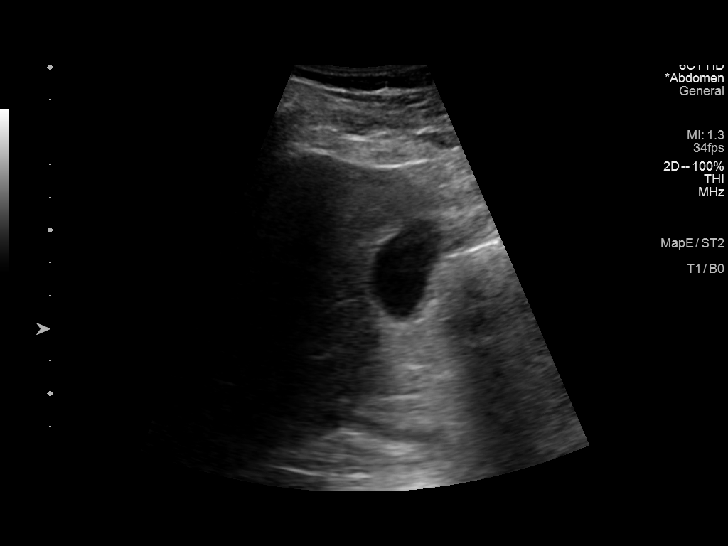
[im 13/37]
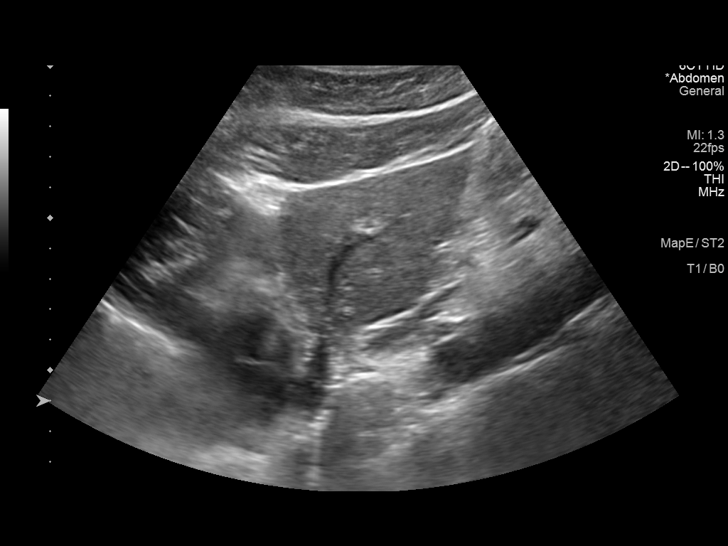
[im 14/37]
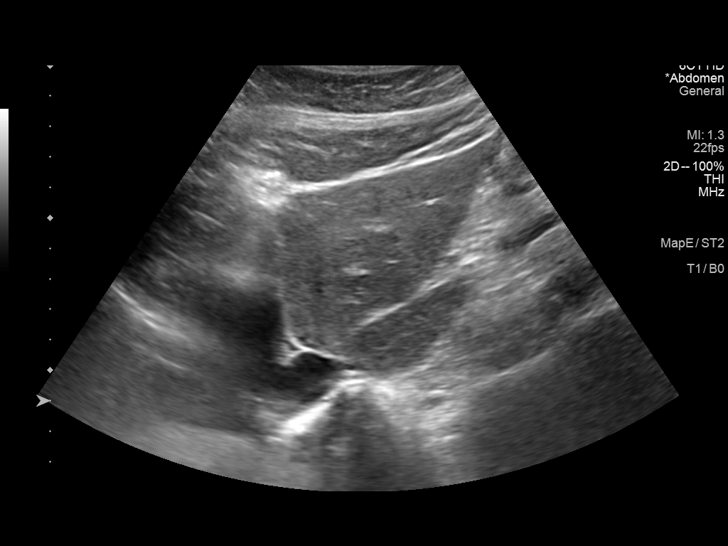
[im 17/37]
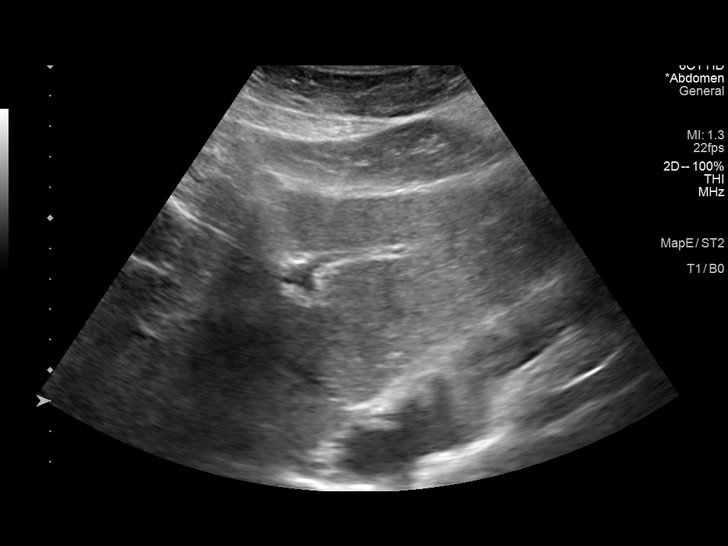
[im 20/37]
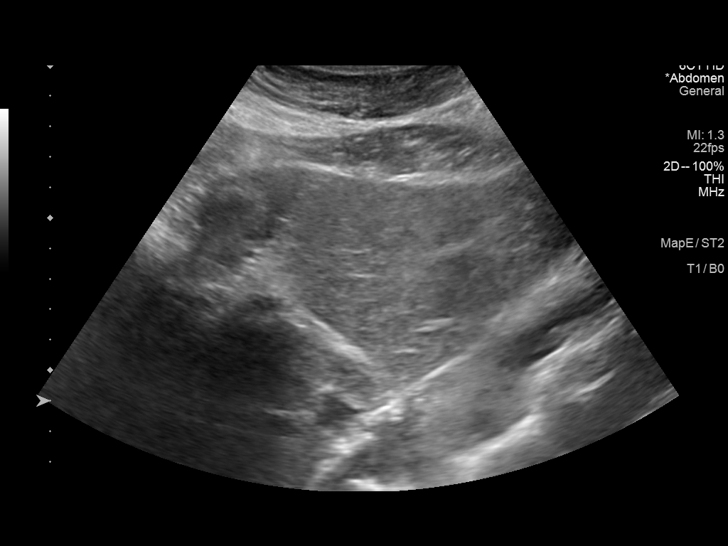
[im 23/37]
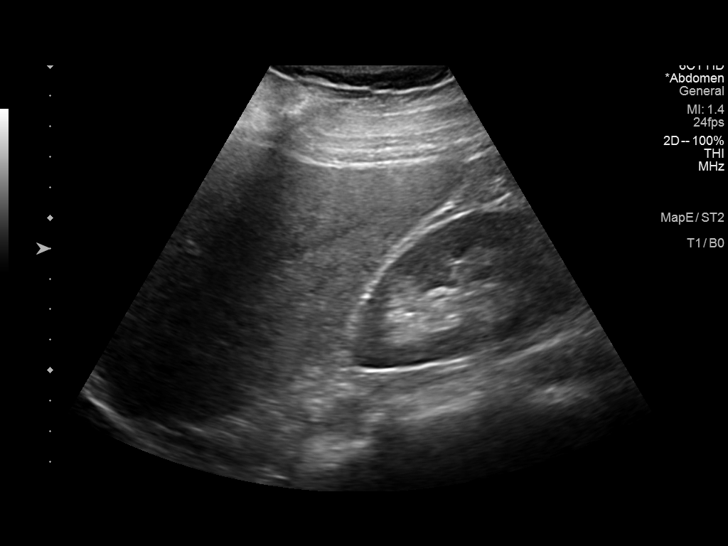
[im 25/37]
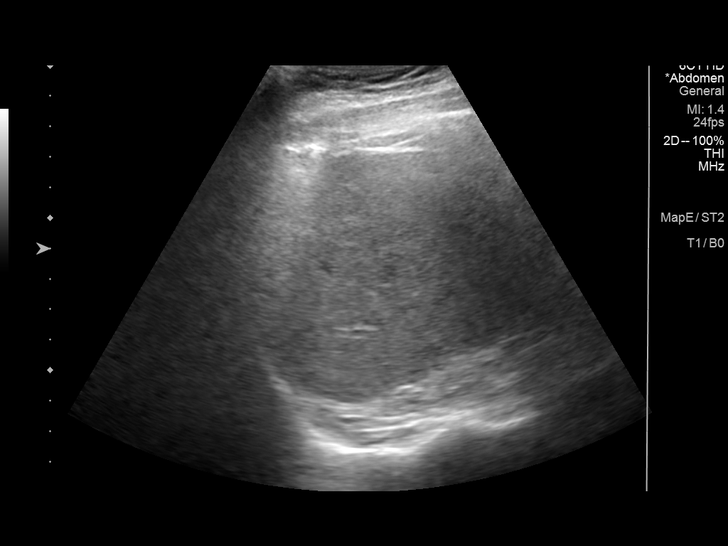
[im 28/37]
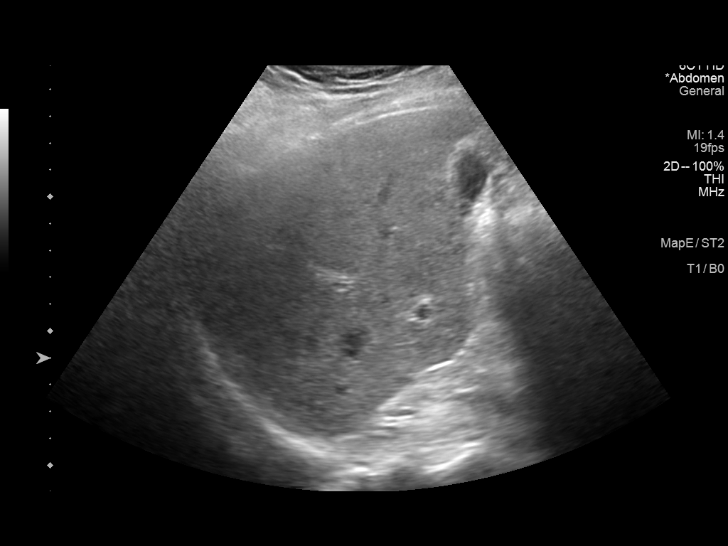
[im 31/37]
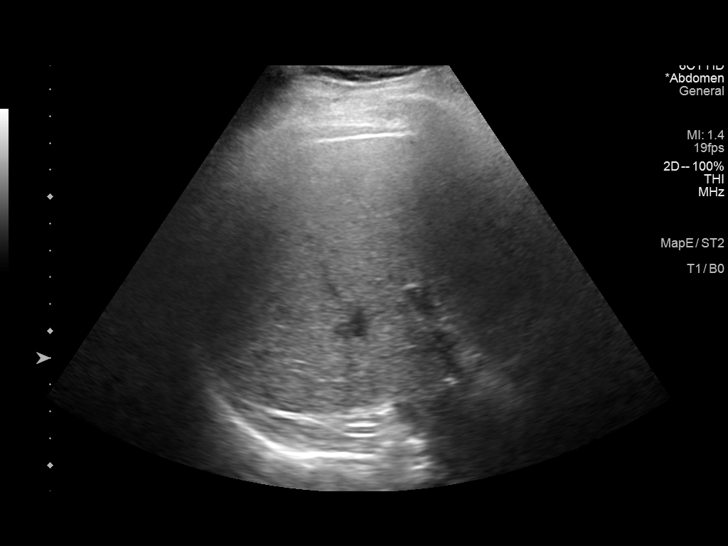
[im 34/37]
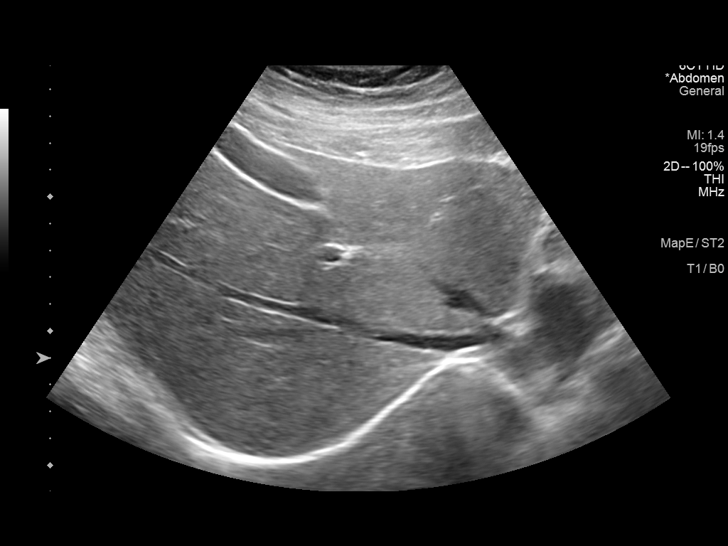
[im 37/37]
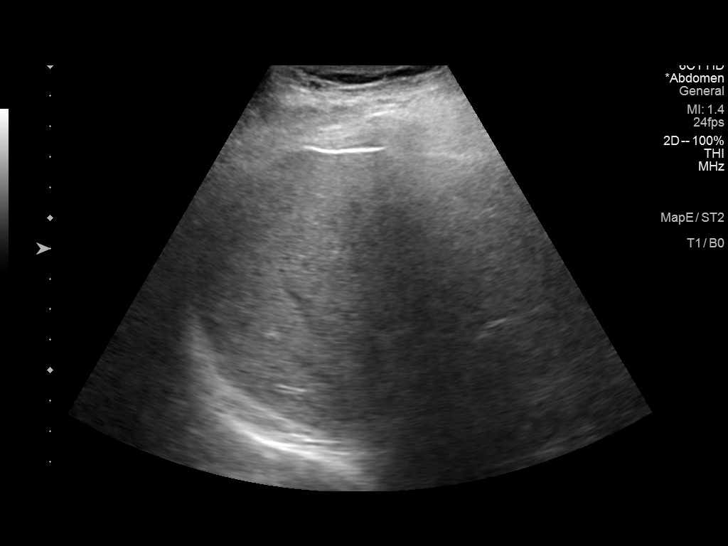

[14 of 25 positions shown; findings below may reference images not displayed]

FINDINGS: Gallbladder:

No gallstones or wall thickening visualized. No sonographic Murphy
sign noted by sonographer.

Common bile duct:

Diameter: 4 mm.

Liver:

No focal lesion identified. Query slightly increased parenchymal
echogenicity. Portal vein is patent on color Doppler imaging with
normal direction of blood flow towards the liver.

Other: None.
IMPRESSION: Possible mild hepatic steatosis.

## 2023-05-16 ENCOUNTER — Other Ambulatory Visit: Payer: Self-pay | Admitting: Interventional Cardiology

## 2023-05-21 ENCOUNTER — Encounter: Payer: Self-pay | Admitting: Cardiovascular Disease

## 2023-05-21 NOTE — Progress Notes (Unsigned)
Cardiology Office Note:  .   Date:  05/22/2023  ID:  [name] [name], DOB 1951/02/11, MRN [id number] PCP: Melida Quitter, MD  [facility] Providers Cardiologist:  Lance Muss, MD {    History of Present Illness: .  Nov. 12, 2024   [name] [name] is a 72 y.o. female patient of Dr. Eldridge Dace.  She has a hx of hyperlipidemiia, PVCs  and coronary artery calcifications   + fam hx of CAD / early CABG   BP is typically well controlled  She does not restrict her salt  Exercised regularly ,  walks her dog, goes to a trainer twice a week  No CP , no dyspnea Has not had any issues with her PVCs   Lipids  Oct. 2023 Chol is 94 HDL = 59 LDL = 16 Trigs 100     ROS:   Studies Reviewed: Marland Kitchen   EKG Interpretation Date/Time:  Tuesday May 22 2023 09:13:52 EST Ventricular Rate:  69 PR Interval:  170 QRS Duration:  66 QT Interval:  402 QTC Calculation: 430 R Axis:   21  Text Interpretation: Normal sinus rhythm Normal ECG When compared with ECG of 15-Mar-2005 09:13, No significant change was found Confirmed by Kristeen Miss [phone]) on 05/22/2023 5:04:33 PM   EKG Interpretation Date/Time:  Tuesday May 22 2023 09:13:52 EST Ventricular Rate:  69 PR Interval:  170 QRS Duration:  66 QT Interval:  402 QTC Calculation: 430 R Axis:   21  Text Interpretation: Normal sinus rhythm Normal ECG When compared with ECG of 15-Mar-2005 09:13, No significant change was found Confirmed by Kristeen Miss ([id number]) on 05/22/2023 5:04:33 PM   Risk Assessment/Calculations:     HYPERTENSION CONTROL Vitals:   05/22/23 0915 05/22/23 0942  BP: (!) 156/90 (!) 148/88    The patient's blood pressure is elevated above target today. {Click here if intervention needs to be changed Refresh Note :1}  In order to address the patient's elevated BP: A current anti-hypertensive medication was adjusted today.          Physical Exam:   VS:  BP (!) 148/88   Pulse 71   Ht 5\' 4"  (1.626 m)   Wt  153 lb 6.4 oz (69.6 kg)   SpO2 96%   BMI 26.33 kg/m    Wt Readings from Last 3 Encounters:  05/22/23 153 lb 6.4 oz (69.6 kg)  05/03/22 161 lb (73 kg)  01/27/21 166 lb 3.2 oz (75.4 kg)    GEN: Well nourished, well developed in no acute distress NECK: No JVD; No carotid bruits CARDIAC: RRR, no murmurs, rubs, gallops RESPIRATORY:  Clear to auscultation without rales, wheezing or rhonchi  ABDOMEN: Soft, non-tender, non-distended EXTREMITIES:  No edema; No deformity   ASSESSMENT AND PLAN: .   1.  Hypertension: [name] [name] presents with mildly elevated blood pressure.  She admits that she is not as good at restricting her salt.  Will increase the irbesartan to 300 mg a day.  Meanwhile she will work on restricting her salt and starting a better exercise program.  I will see her in approximately 6 weeks for follow-up visit and basic metabolic profile.  2.  Hyperlipidemia: She is on Repatha. She was previously on rosuvastatin and her LDL was 116.  She discontinued the rosuvastatin thinking that she did not need it now her last LDL was 73.  I would like her LDL in the 50-60 range given her coronary artery calcifications and her strong family history of  premature coronary artery disease.  Will have her improve her diet, exercise program.  Will recheck her lipids and ALT in 3 months.  She may benefit from being on a low-dose rosuvastatin such as 5 mg 3 days a week.        Dispo: 1 year    Signed, Kristeen Miss, MD

## 2023-05-22 ENCOUNTER — Encounter: Payer: Self-pay | Admitting: Cardiovascular Disease

## 2023-05-22 ENCOUNTER — Ambulatory Visit: Payer: Medicare Other | Attending: Cardiovascular Disease | Admitting: Cardiovascular Disease

## 2023-05-22 VITALS — BP 148/88 | HR 71 | Ht 64.0 in | Wt 153.4 lb

## 2023-05-22 DIAGNOSIS — Z79899 Other long term (current) drug therapy: Secondary | ICD-10-CM | POA: Insufficient documentation

## 2023-05-22 DIAGNOSIS — R931 Abnormal findings on diagnostic imaging of heart and coronary circulation: Secondary | ICD-10-CM | POA: Diagnosis present

## 2023-05-22 DIAGNOSIS — E785 Hyperlipidemia, unspecified: Secondary | ICD-10-CM | POA: Insufficient documentation

## 2023-05-22 DIAGNOSIS — I10 Essential (primary) hypertension: Secondary | ICD-10-CM | POA: Diagnosis present

## 2023-05-22 MED ORDER — REPATHA SURECLICK 140 MG/ML ~~LOC~~ SOAJ: SUBCUTANEOUS | 11 refills | Status: DC

## 2023-05-22 MED ORDER — IRBESARTAN 300 MG PO TABS: 300.0000 mg | ORAL_TABLET | Freq: Every day | ORAL | 3 refills | Status: AC

## 2023-05-22 NOTE — Patient Instructions (Addendum)
Medication Instructions:  INCREASE Irbesartan/Avapro to 300mg  daily  REFILLED Repatha *If you need a refill on your cardiac medications before your next appointment, please call your pharmacy*  Lab Work: BMET at next visit Lipids, ALT, BMET in 3 months If you have labs (blood work) drawn today and your tests are completely normal, you will receive your results only by: MyChart Message (if you have MyChart) OR A paper copy in the mail If you have any lab test that is abnormal or we need to change your treatment, we will call you to review the results.  Follow-Up: At [facility], you and your health needs are our priority.  As part of our continuing mission to provide you with exceptional heart care, we have created designated Provider Care Teams.  These Care Teams include your primary Cardiologist (physician) and Advanced Practice Providers (APPs -  Physician Assistants and Nurse Practitioners) who all work together to provide you with the care you need, when you need it.  Your next appointment:   6 week(s)  Provider:   Kristeen Miss, MD   Other Instructions:  [place] BP cuff

## 2023-07-10 LAB — BASIC METABOLIC PANEL
BUN/Creatinine Ratio: 16 (ref 12–28)
BUN: 15 mg/dL (ref 8–27)
CO2: 22 mmol/L (ref 20–29)
Calcium: 9.4 mg/dL (ref 8.7–10.3)
Chloride: 108 mmol/L — ABNORMAL HIGH (ref 96–106)
Creatinine, Ser: 0.94 mg/dL (ref 0.57–1.00)
Glucose: 101 mg/dL — ABNORMAL HIGH (ref 70–99)
Potassium: 4.5 mmol/L (ref 3.5–5.2)
Sodium: 142 mmol/L (ref 134–144)
eGFR: 65 mL/min/{1.73_m2} (ref >59)

## 2023-07-16 NOTE — Progress Notes (Signed)
  Cardiology Office Note:  .   Date:  07/18/2023  ID:  [name] [name], DOB [birth year], MRN [id number] PCP: Stephane Leita DEL, MD  [facility] Providers Cardiologist:  Candyce Reek, MD {    History of Present Illness: .  Nov. 12, 2024   [name] [name] is a 73 y.o. female patient of Dr. Reek.  She has a hx of hyperlipidemiia, PVCs  and coronary artery calcifications   + fam hx of CAD / early CABG   BP is typically well controlled  She does not restrict her salt  Exercised regularly ,  walks her dog, goes to a trainer twice a week  No CP , no dyspnea Has not had any issues with her PVCs   Lipids  Oct. 2023 Chol is 94 HDL = 59 LDL = 16 Trigs 100     Jan. 8, 2025 [name] [name] is seen for follow up of her HTN, we increased her Irbesartan  at her last visit BP look  Her last LDL is 76  She works out with a systems analyst several times a week     ROS:   Studies Reviewed: .           Risk Assessment/Calculations:        Physical Exam: Blood pressure 130/64, pulse 62, height 5' 4 (1.626 m), weight 153 lb 12.8 oz (69.8 kg), SpO2 98%.       GEN:  Well nourished, well developed in no acute distress HEENT: Normal NECK: No JVD; No carotid bruits LYMPHATICS: No lymphadenopathy CARDIAC: RRR , no murmurs, rubs, gallops RESPIRATORY:  Clear to auscultation without rales, wheezing or rhonchi  ABDOMEN: Soft, non-tender, non-distended MUSCULOSKELETAL:  No edema; No deformity  SKIN: Warm and dry NEUROLOGIC:  Alert and oriented x 3   ASSESSMENT AND PLAN: .   1.  Hypertension:  BP looks great .  Cont current meds.    2.  Hyperlipidemia:  lipids look good .  Continue Repatha  LDL is 76.   She will continue to watch her saturated fats , exercise regularly          Dispo: 1 year    Signed, Aleene Passe, MD

## 2023-07-17 ENCOUNTER — Telehealth: Payer: Self-pay | Admitting: Cardiovascular Disease

## 2023-07-17 NOTE — Telephone Encounter (Signed)
Pt is returning nurse call regarding her results and is requesting a callback. Please advise

## 2023-07-18 ENCOUNTER — Encounter: Payer: Self-pay | Admitting: Cardiovascular Disease

## 2023-07-18 ENCOUNTER — Ambulatory Visit: Payer: Medicare Other | Attending: Cardiovascular Disease | Admitting: Cardiovascular Disease

## 2023-07-18 VITALS — BP 130/64 | HR 62 | Ht 64.0 in | Wt 153.8 lb

## 2023-07-18 DIAGNOSIS — E782 Mixed hyperlipidemia: Secondary | ICD-10-CM | POA: Insufficient documentation

## 2023-07-18 DIAGNOSIS — I10 Essential (primary) hypertension: Secondary | ICD-10-CM | POA: Insufficient documentation

## 2023-07-18 NOTE — Patient Instructions (Addendum)
 Medication Instructions:  Your physician recommends that you continue on your current medications as directed. Please refer to the Current Medication list given to you today.  *If you need a refill on your cardiac medications before your next appointment, please call your pharmacy*  Lab Work: None ordered today.  Testing/Procedures: None ordered today.  Follow-Up: At [facility], you and your health needs are our priority.  As part of our continuing mission to provide you with exceptional heart care, we have created designated Provider Care Teams.  These Care Teams include your primary Cardiologist (physician) and Advanced Practice Providers (APPs -  Physician Assistants and Nurse Practitioners) who all work together to provide you with the care you need, when you need it.  Your next appointment:   1 year(s)  The format for your next appointment:   In Person  Provider:   Soyla Merck, MD

## 2023-07-20 LAB — LAB REPORT - SCANNED: EGFR: 54.8

## 2024-01-07 ENCOUNTER — Telehealth: Payer: Self-pay | Admitting: *Deleted

## 2024-01-07 NOTE — Telephone Encounter (Signed)
   Pre-operative Risk Assessment    Patient Name: [name] [name]  DOB: Dec 21, 1950 MRN: [id number]   Date of last office visit: 07/18/23 DR. NAHSER Date of next office visit: NONE   Request for Surgical Clearance    Procedure:  LEFT SMALL FINGER OPEN REDUCTION INTERNAL FIXATION REPAIR AS INDICATED  Date of Surgery:  Clearance 01/09/24                                Surgeon:  DR. FRED [facility] Surgeon's Group or Practice Name:  [facility] Phone number:  [phone] Fax number:  [phone]; [name]   Type of Clearance Requested:   - Medical  - Pharmacy:  Hold Aspirin     Type of Anesthesia:  REGIONAL WITH IV SEDATION    Additional requests/questions:    Bonney Niels Jest   01/07/2024, 4:16 PM

## 2024-01-08 ENCOUNTER — Telehealth: Payer: Self-pay | Admitting: *Deleted

## 2024-01-08 ENCOUNTER — Ambulatory Visit: Attending: Nurse Practitioner

## 2024-01-08 DIAGNOSIS — Z01810 Encounter for preprocedural cardiovascular examination: Secondary | ICD-10-CM | POA: Diagnosis present

## 2024-01-08 NOTE — Progress Notes (Signed)
 Virtual Visit via Telephone Note   Because of [name] [name] co-morbid illnesses, she is at least at moderate risk for complications without adequate follow up.  This format is felt to be most appropriate for this patient at this time.  Due to technical limitations with video connection (technology), today's appointment will be conducted as an audio only telehealth visit, and [name] [name] verbally agreed to proceed in this manner.   All issues noted in this document were discussed and addressed.  No physical exam could be performed with this format.  Evaluation Performed:  Preoperative cardiovascular risk assessment _____________   Date:  01/08/2024   Patient ID:  [name] [name], DOB 12-19-1950, MRN [id number] Patient Location:  Home Provider location:   Office  Primary Care Provider:  Stephane Leita DEL, MD Primary Cardiologist:  Candyce Reek, MD  Chief Complaint / Patient Profile   73 y.o. y/o female with a h/o HLD, HTN, coronary calcifications, family history of early CAD who is pending left finger ORIF and presents today for telephonic preoperative cardiovascular risk assessment.  History of Present Illness    [name] [name] is a 73 y.o. female who presents via audio/video conferencing for a telehealth visit today.  Pt was last seen in cardiology clinic on 07/18/2023 by Dr Alveta.  At that time [name] [name] was doing well with no new cardiac complaints. She had previously had irbesartan  increased due to elevated BP and was well-controlled during visit.  There have been no interval UC or ED visits.  The patient is now pending procedure as outlined above. Since her last visit, she has been doing well with no new cardiac complaints.  She is active and able to complete greater than 4 METS of activity without any difficulty.  She reports that her blood pressures have been stable in the 120s over 70s.  She denies chest pain, shortness of breath, lower extremity edema, fatigue, palpitations, melena,  hematuria, hemoptysis, diaphoresis, weakness, presyncope, syncope, orthopnea, and PND.    Past Medical History    Past Medical History:  Diagnosis Date   Allergy    Anxiety    Cataract    bilateral and removed    Depression    Depression    Hyperlipidemia    Hypertension    Obesity    Osteopenia    PVC's (premature ventricular contractions)    no issues with this   Past Surgical History:  Procedure Laterality Date   BROW LIFT     CATARACT EXTRACTION Bilateral    with lens implants   COLONOSCOPY  2005   frozen shoulder Right    TONSILLECTOMY     as child    TUBAL LIGATION     WISDOM TOOTH EXTRACTION      Allergies  Allergies  Allergen Reactions   Shellfish Allergy Diarrhea and Nausea And Vomiting   Codeine Nausea Only    Home Medications    Prior to Admission medications   Medication Sig Start Date End Date Taking? Authorizing Provider  aspirin 81 MG tablet Take 160 mg by mouth daily.    [provider]  bismuth subsalicylate (PEPTO-BISMOL) 262 MG chewable tablet 2 tablets with meals and at bedtime Orally Four times a day As needed 01/04/22   [provider]  buPROPion (WELLBUTRIN XL) 300 MG 24 hr tablet Take 300 mg by mouth daily.    [provider]  Evolocumab  (REPATHA  SURECLICK) 140 MG/ML SOAJ INJECT 1 PEN INTO THE SKIN EVERY  14 DAYS 05/22/23   Nahser, Aleene PARAS, MD  fluorouracil (EFUDEX) 5 % cream Apply topically daily. 06/05/23   [provider]  irbesartan  (AVAPRO ) 300 MG tablet Take 1 tablet (300 mg total) by mouth daily. 05/22/23   Nahser, Aleene PARAS, MD  loratadine (CLARITIN) 10 MG tablet Take 10 mg by mouth daily.    [provider]  metoprolol succinate (TOPROL-XL) 50 MG 24 hr tablet Take 50 mg by mouth daily. Take with or immediately following a meal.    [provider]  Multiple Vitamins-Minerals (MULTIVITAMIN WITH MINERALS) tablet Take 1 tablet by mouth daily.    [provider]     Physical Exam    Vital Signs:  [name] [name] does not have vital signs available for review today.120/75  Given telephonic nature of communication, physical exam is limited. AAOx3. NAD. Normal affect.  Speech and respirations are unlabored.  Accessory Clinical Findings    None  Assessment & Plan    1.  Preoperative Cardiovascular Risk Assessment: - Patient's RCRI score is 0.4%  The patient affirms she has been doing well without any new cardiac symptoms. They are able to achieve 7 METS without cardiac limitations. Therefore, based on ACC/AHA guidelines, the patient would be at acceptable risk for the planned procedure without further cardiovascular testing. The patient was advised that if she develops new symptoms prior to surgery to contact our office to arrange for a follow-up visit, and she verbalized understanding.   The patient was advised that if she develops new symptoms prior to surgery to contact our office to arrange for a follow-up visit, and she verbalized understanding.  Patient reports not holding aspirin but if needed could hold 5 to 7 days prior to procedure.  A copy of this note will be routed to requesting surgeon.  Time:   Today, I have spent 6 minutes with the patient with telehealth technology discussing medical history, symptoms, and management plan.     Wyn Raddle, Jackee Shove, NP  01/08/2024, 12:26 PM

## 2024-01-08 NOTE — Telephone Encounter (Signed)
 Surgery scheduler Joen ORN called back in regard to ASA if needing to be held or not. Per Joen ORN states Dr. Shari should be ok if ASA is not held as she states (he will probably proceed with surgery due to Fx). She said they were more concerned to have clearance for the anesthesiologist.   I informed Joen that I will make a note os our conversation today. Joen thanked me for our help.

## 2024-01-08 NOTE — Telephone Encounter (Signed)
 01/08/24  9:37 AM Note Pt has been added on today 1:20 ok per Jackee Alberts, NP. Procedure 01/09/24. Our office just received this request yesterday. I asked the pt if she has been holding her ASA yet. Pt said no, that she did not know she needed to hold ASA. I informed the pt that I will reach out to the surgeon office if ASA is needing to be held. I did tell the pt again that we only received the request yesterday.      I left a vm for [name] who is covering for Dr. Shari surgery scheduler Duwaine Moats. If Dr. Shari needs ASA to be held they will need to post pone her surgery as she has not been holding ASA yet. I asked for a call back ASAP today to preop to let us  know about ASA.      Med rec and consent are done.     Patient Consent for Virtual Visit        [name] [name] has provided verbal consent on 01/08/2024 for a virtual visit (video or telephone).   CONSENT FOR VIRTUAL VISIT FOR:  [name] [name]  By participating in this virtual visit I agree to the following:  I hereby voluntarily request, consent and authorize [facility] and its employed or contracted physicians, physician assistants, nurse practitioners or other licensed health care professionals (the Practitioner), to provide me with telemedicine health care services (the "Services) as deemed necessary by the treating Practitioner. I acknowledge and consent to receive the Services by the Practitioner via telemedicine. I understand that the telemedicine visit will involve communicating with the Practitioner through live audiovisual communication technology and the disclosure of certain medical information by electronic transmission. I acknowledge that I have been given the opportunity to request an in-person assessment or other available alternative prior to the telemedicine visit and am voluntarily participating in the telemedicine visit.  I understand that I have the right to withhold or withdraw my consent to the  use of telemedicine in the course of my care at any time, without affecting my right to future care or treatment, and that the Practitioner or I may terminate the telemedicine visit at any time. I understand that I have the right to inspect all information obtained and/or recorded in the course of the telemedicine visit and may receive copies of available information for a reasonable fee.  I understand that some of the potential risks of receiving the Services via telemedicine include:  Delay or interruption in medical evaluation due to technological equipment failure or disruption; Information transmitted may not be sufficient (e.g. poor resolution of images) to allow for appropriate medical decision making by the Practitioner; and/or  In rare instances, security protocols could fail, causing a breach of personal health information.  Furthermore, I acknowledge that it is my responsibility to provide information about my medical history, conditions and care that is complete and accurate to the best of my ability. I acknowledge that Practitioner's advice, recommendations, and/or decision may be based on factors not within their control, such as incomplete or inaccurate data provided by me or distortions of diagnostic images or specimens that may result from electronic transmissions. I understand that the practice of medicine is not an exact science and that Practitioner makes no warranties or guarantees regarding treatment outcomes. I acknowledge that a copy of this consent can be made available to me via my patient portal [facility] MyChart), or I can request a printed copy  by calling the office of [facility].    I understand that my insurance will be billed for this visit.   I have read or had this consent read to me. I understand the contents of this consent, which adequately explains the benefits and risks of the Services being provided via telemedicine.  I have been provided ample  opportunity to ask questions regarding this consent and the Services and have had my questions answered to my satisfaction. I give my informed consent for the services to be provided through the use of telemedicine in my medical care

## 2024-01-08 NOTE — Telephone Encounter (Signed)
 Pt has been added on today 1:20 ok per Jackee Alberts, NP. Procedure 01/09/24. Our office just received this request yesterday. I asked the pt if she has been holding her ASA yet. Pt said no, that she did not know she needed to hold ASA. I informed the pt that I will reach out to the surgeon office if ASA is needing to be held. I did tell the pt again that we only received the request yesterday.    I left a vm for [name] who is covering for Dr. Shari surgery scheduler Duwaine Moats. If Dr. Shari needs ASA to be held they will need to post pone her surgery as she has not been holding ASA yet. I asked for a call back ASAP today to preop to let us  know about ASA.

## 2024-01-08 NOTE — Telephone Encounter (Signed)
 Tried calling patient to schedule televisit today per preop APP Jackee Alberts, NP at 1:20 no answer left a detailed vm to call back. Patient needs to be asked if she has been off her aspirin

## 2024-01-08 NOTE — Telephone Encounter (Signed)
   Name: [name] [name]  DOB: [birth year]  MRN: [id number]  Primary Cardiologist: Candyce Reek, MD   Preoperative team, please contact this patient and set up a phone call appointment for further preoperative risk assessment. Please obtain consent and complete medication review. Thank you for your help.  Please verify that patient has started to hold aspirin and please add on for today in any available slot.  I confirm that guidance regarding antiplatelet and oral anticoagulation therapy has been completed and, if necessary, noted below  Patient can hold ASA 81 mg 5 to 7 days prior to procedure  I also confirmed the patient resides in the state of [place] . As per [facility] telemedicine laws, the patient must reside in the state in which the provider is licensed.   [name] [name], [name] Shove, NP 01/08/2024, 8:02 AM [facility]

## 2024-04-18 ENCOUNTER — Other Ambulatory Visit: Payer: Self-pay | Admitting: Pharmacist

## 2024-04-18 MED ORDER — REPATHA SURECLICK 140 MG/ML ~~LOC~~ SOAJ: SUBCUTANEOUS | 11 refills | Status: DC

## 2024-06-27 ENCOUNTER — Encounter: Payer: Self-pay | Admitting: Internal Medicine

## 2024-07-29 ENCOUNTER — Encounter: Payer: Self-pay | Admitting: Internal Medicine

## 2024-07-30 ENCOUNTER — Other Ambulatory Visit (HOSPITAL_COMMUNITY): Payer: Self-pay

## 2024-07-30 ENCOUNTER — Telehealth: Payer: Self-pay | Admitting: Pharmacy Technician

## 2024-07-30 NOTE — Telephone Encounter (Signed)
 Pharmacy Patient Advocate Encounter   Received notification from Patient Advice Request messages that prior authorization for REPATHA  is required/requested.   Insurance verification completed.   The patient is insured through [place].   Per test claim: PA required; PA submitted to above mentioned insurance via Latent Key/confirmation #/EOC [id number] Status is pending

## 2024-07-30 NOTE — Telephone Encounter (Signed)
 Pharmacy Patient Advocate Encounter  Received notification from [employer] that Prior Authorization for repatha  has been APPROVED from 07/29/24 to 07/09/25   PA #/Case ID/Reference #: [id number]

## 2024-08-04 ENCOUNTER — Ambulatory Visit: Admitting: Internal Medicine

## 2024-08-13 ENCOUNTER — Ambulatory Visit: Admitting: Internal Medicine

## 2024-08-13 VITALS — BP 130/72 | HR 67 | Ht 64.0 in | Wt 147.6 lb

## 2024-08-13 DIAGNOSIS — I10 Essential (primary) hypertension: Secondary | ICD-10-CM

## 2024-08-13 DIAGNOSIS — E782 Mixed hyperlipidemia: Secondary | ICD-10-CM

## 2024-08-13 MED ORDER — REPATHA SURECLICK 140 MG/ML ~~LOC~~ SOAJ: SUBCUTANEOUS | 11 refills | Status: AC

## 2024-08-13 NOTE — Patient Instructions (Signed)
 Medication Instructions:  No Changes  *If you need a refill on your cardiac medications before your next appointment, please call your pharmacy*  Lab Work: None  Follow-Up: At [facility], you and your health needs are our priority.  As part of our continuing mission to provide you with exceptional heart care, our providers are all part of one team.  This team includes your primary Cardiologist (physician) and Advanced Practice Providers or APPs (Physician Assistants and Nurse Practitioners) who all work together to provide you with the care you need, when you need it.  Your next appointment:   1 year(s) (Call in November for a February 2027 appointment)  Provider:   Gayatri A Acharya, MD

## 2024-08-13 NOTE — Progress Notes (Unsigned)
" °  Cardiology Office Note:  .   Date:  08/13/2024  ID:  [name] [name], DOB 1951-05-11, MRN [id number] PCP: Stephane Leita DEL, MD  [facility] Providers Cardiologist:  Candyce Reek, MD    History of Present Illness: .   [name] [name] is a 74 y.o. female.  Discussed the use of AI scribe software for clinical note transcription with the patient, who gave verbal consent to proceed.  History of Present Illness     ROS: negative except per HPI above.  Studies Reviewed: .        Results  Risk Assessment/Calculations:   {Does this patient have ATRIAL FIBRILLATION?:[phone]}   Physical Exam:   VS:  There were no vitals taken for this visit.   Wt Readings from Last 3 Encounters:  07/18/23 153 lb 12.8 oz (69.8 kg)  05/22/23 153 lb 6.4 oz (69.6 kg)  05/03/22 161 lb (73 kg)     Physical Exam    ASSESSMENT AND PLAN: .    Assessment and Plan Assessment & Plan       Soyla Merck, MD, [facility] "

## 2024-09-16 ENCOUNTER — Ambulatory Visit: Admitting: Internal Medicine
